# Patient Record
Sex: Male | Born: 1955 | Race: Black or African American | Hispanic: No | Marital: Married | Smoking: Former smoker
Health system: Southern US, Community
[De-identification: ages and names within clinical notes are randomized; demographics above are authoritative.]

## PROBLEM LIST (undated history)

## (undated) DIAGNOSIS — M1990 Unspecified osteoarthritis, unspecified site: Secondary | ICD-10-CM

## (undated) DIAGNOSIS — R739 Hyperglycemia, unspecified: Secondary | ICD-10-CM

## (undated) DIAGNOSIS — I10 Essential (primary) hypertension: Secondary | ICD-10-CM

## (undated) HISTORY — DX: Hyperglycemia, unspecified: R73.9

## (undated) HISTORY — DX: Unspecified osteoarthritis, unspecified site: M19.90

---

## 2004-07-10 ENCOUNTER — Emergency Department: Payer: Self-pay | Admitting: Emergency Medicine

## 2006-07-30 ENCOUNTER — Emergency Department: Payer: Self-pay | Admitting: Emergency Medicine

## 2006-11-10 ENCOUNTER — Inpatient Hospital Stay: Payer: Self-pay | Admitting: Internal Medicine

## 2007-04-22 ENCOUNTER — Ambulatory Visit: Payer: Self-pay | Admitting: Rheumatology

## 2008-09-22 ENCOUNTER — Ambulatory Visit: Payer: Self-pay | Admitting: Family Medicine

## 2008-11-28 ENCOUNTER — Ambulatory Visit: Payer: Self-pay | Admitting: Podiatry

## 2009-07-25 ENCOUNTER — Ambulatory Visit: Payer: Self-pay | Admitting: Family Medicine

## 2009-07-25 HISTORY — PX: SPIROMETRY: SHX456

## 2009-11-29 ENCOUNTER — Ambulatory Visit (HOSPITAL_COMMUNITY)
Admission: RE | Admit: 2009-11-29 | Discharge: 2009-11-30 | Payer: Self-pay | Source: Home / Self Care | Admitting: Specialist

## 2009-11-29 HISTORY — PX: ROTATOR CUFF REPAIR: SHX139

## 2010-04-16 LAB — LIPID PANEL
CHOLESTEROL: 177 mg/dL (ref 0–200)
HDL: 55 mg/dL (ref 35–70)
LDL Cholesterol: 102 mg/dL
TRIGLYCERIDES: 98 mg/dL (ref 40–160)

## 2010-04-16 LAB — BASIC METABOLIC PANEL
BUN: 13 mg/dL (ref 4–21)
CREATININE: 0.9 mg/dL (ref 0.6–1.3)
Glucose: 105 mg/dL
POTASSIUM: 4 mmol/L (ref 3.4–5.3)
Sodium: 139 mmol/L (ref 137–147)

## 2010-04-16 LAB — HEPATIC FUNCTION PANEL
ALT: 24 U/L (ref 10–40)
AST: 16 U/L (ref 14–40)

## 2010-04-16 LAB — PSA: PSA: 1.1

## 2010-04-16 LAB — TSH: TSH: 1.65 u[IU]/mL (ref 0.41–5.90)

## 2010-06-14 LAB — CBC
HCT: 42.6 % (ref 39.0–52.0)
Platelets: 251 10*3/uL (ref 150–400)
RBC: 4.49 MIL/uL (ref 4.22–5.81)
WBC: 5.6 10*3/uL (ref 4.0–10.5)

## 2010-06-14 LAB — SURGICAL PCR SCREEN
MRSA, PCR: NEGATIVE
Staphylococcus aureus: NEGATIVE

## 2010-06-14 LAB — BASIC METABOLIC PANEL
CO2: 28 mEq/L (ref 19–32)
GFR calc non Af Amer: >60 mL/min (ref >60)
Potassium: 3.5 mEq/L (ref 3.5–5.1)
Sodium: 140 mEq/L (ref 135–145)

## 2010-07-19 LAB — HEMOGLOBIN A1C: Hemoglobin A1C: 5.3
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
## 2011-03-26 ENCOUNTER — Ambulatory Visit: Payer: Self-pay | Admitting: Family Medicine

## 2011-06-19 IMAGING — CR DG CHEST 2V
1 series · 2 of 2 positions shown · non-contrast
Comparison: none

REASON FOR EXAM: shortness of breath
COMMENTS:

[Series 1: view not recorded · 0.17mm/px · 2 of 2 slices shown]
[im 1/2]
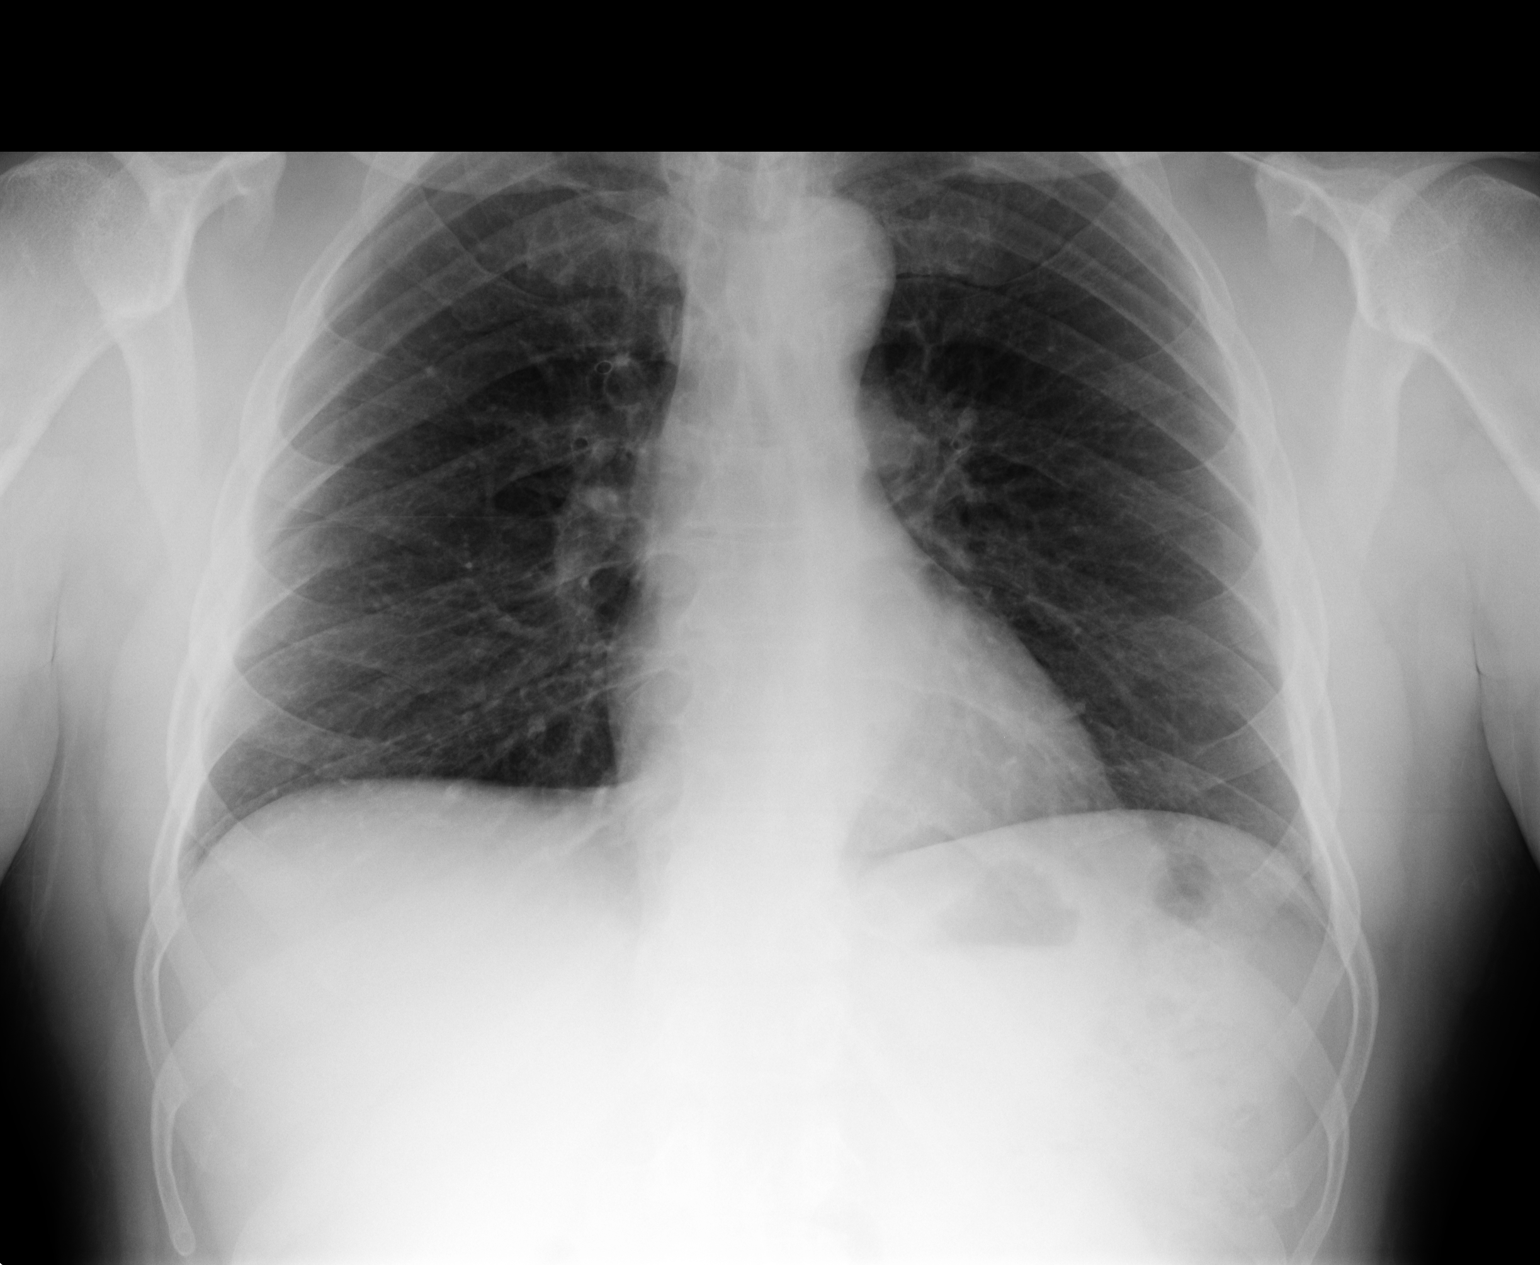
[im 2/2]
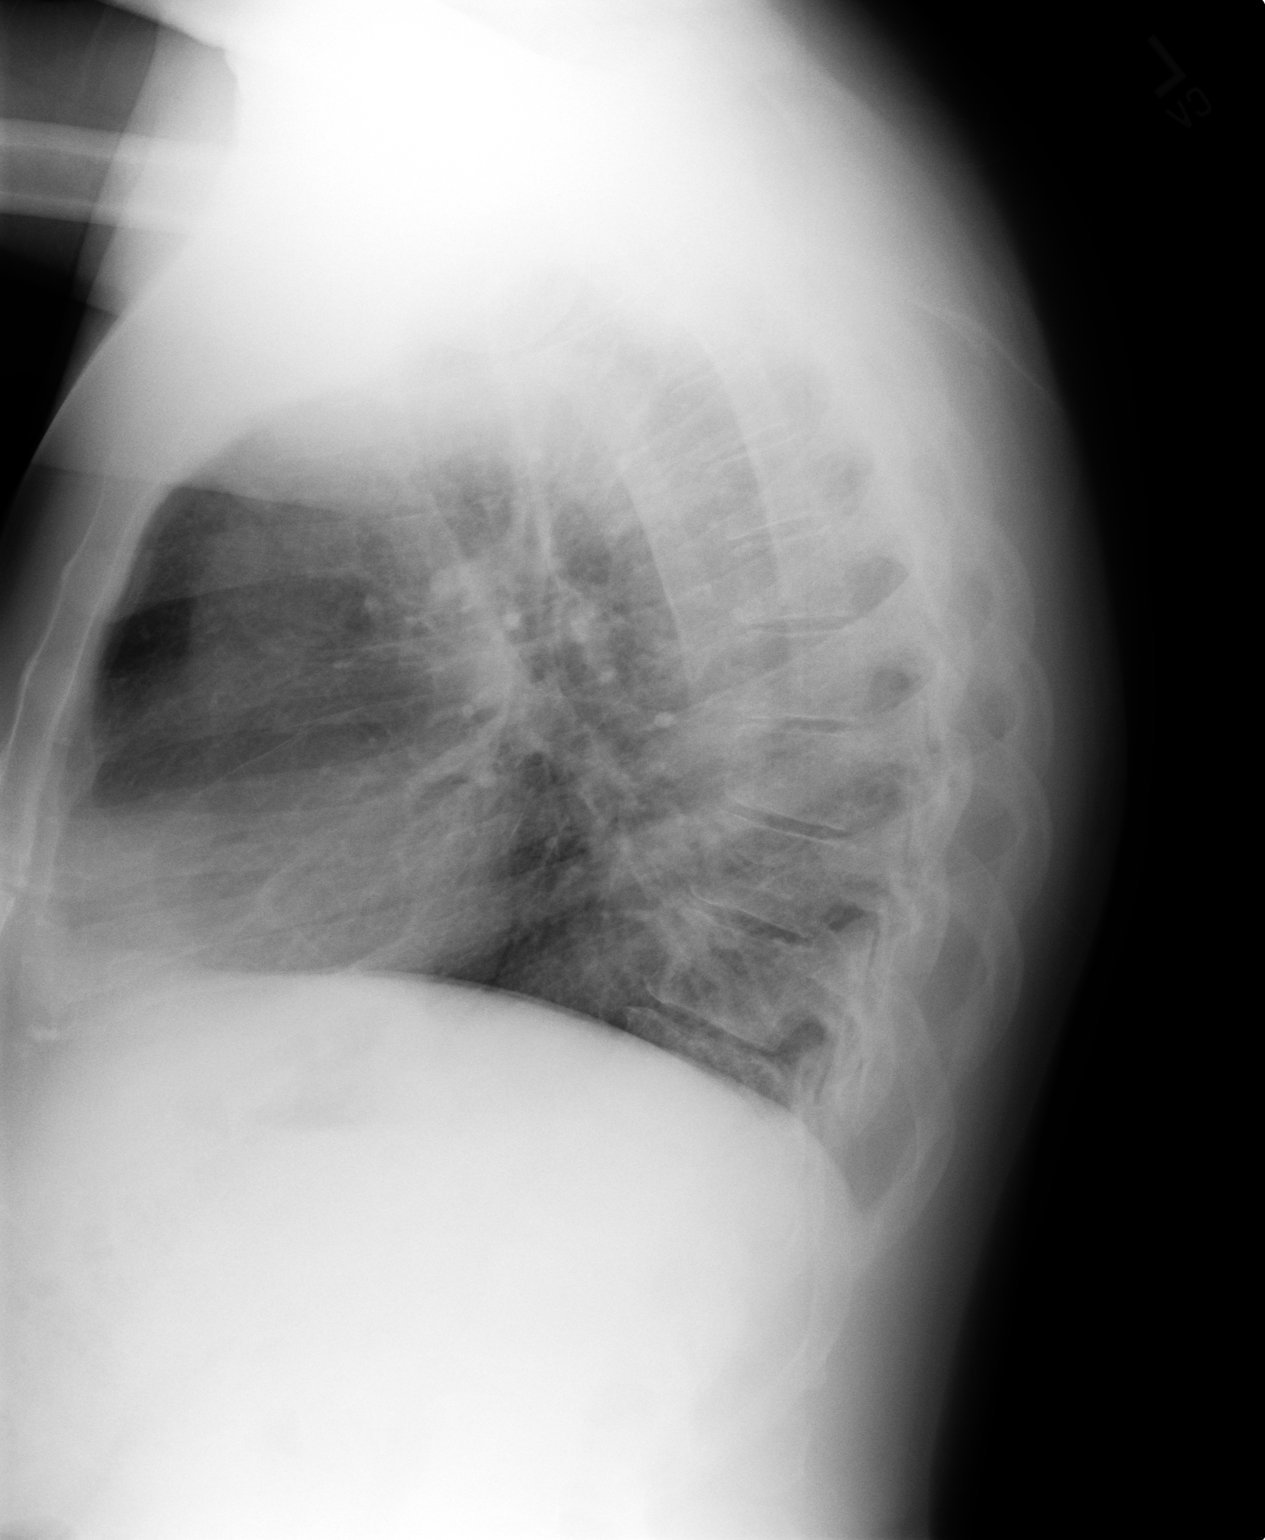

[2 of 2 positions shown; findings below may reference images not displayed]

PROCEDURE:     KDR - KDXR CHEST PA (OR AP) AND LAT  - July 25, 2009  [DATE]

RESULT:     Comparison made to study 22 September, 2008.

The lungs are adequately inflated. There is no focal infiltrate. The
interstitial markings of both lungs are mildly prominent but this is stable.
Is there history of tobacco use? The cardiac silhouette is normal in size.
The pulmonary vascularity is not engorged. There is mild tortuosity of the
descending thoracic aorta. I see no pleural effusion. There is no evidence
of a pneumothorax.
IMPRESSION: I do not see evidence of acute cardiopulmonary abnormality.
I cannot exclude acute bronchitis in the appropriate clinical setting.

## 2013-02-17 IMAGING — CR DG CHEST 2V
1 series · 2 of 2 positions shown · non-contrast
Comparison: none

REASON FOR EXAM: COUGH FEVER
COMMENTS:

[Series 1: pa · 0.17mm/px · 2 of 2 slices shown]
[im 1/2]
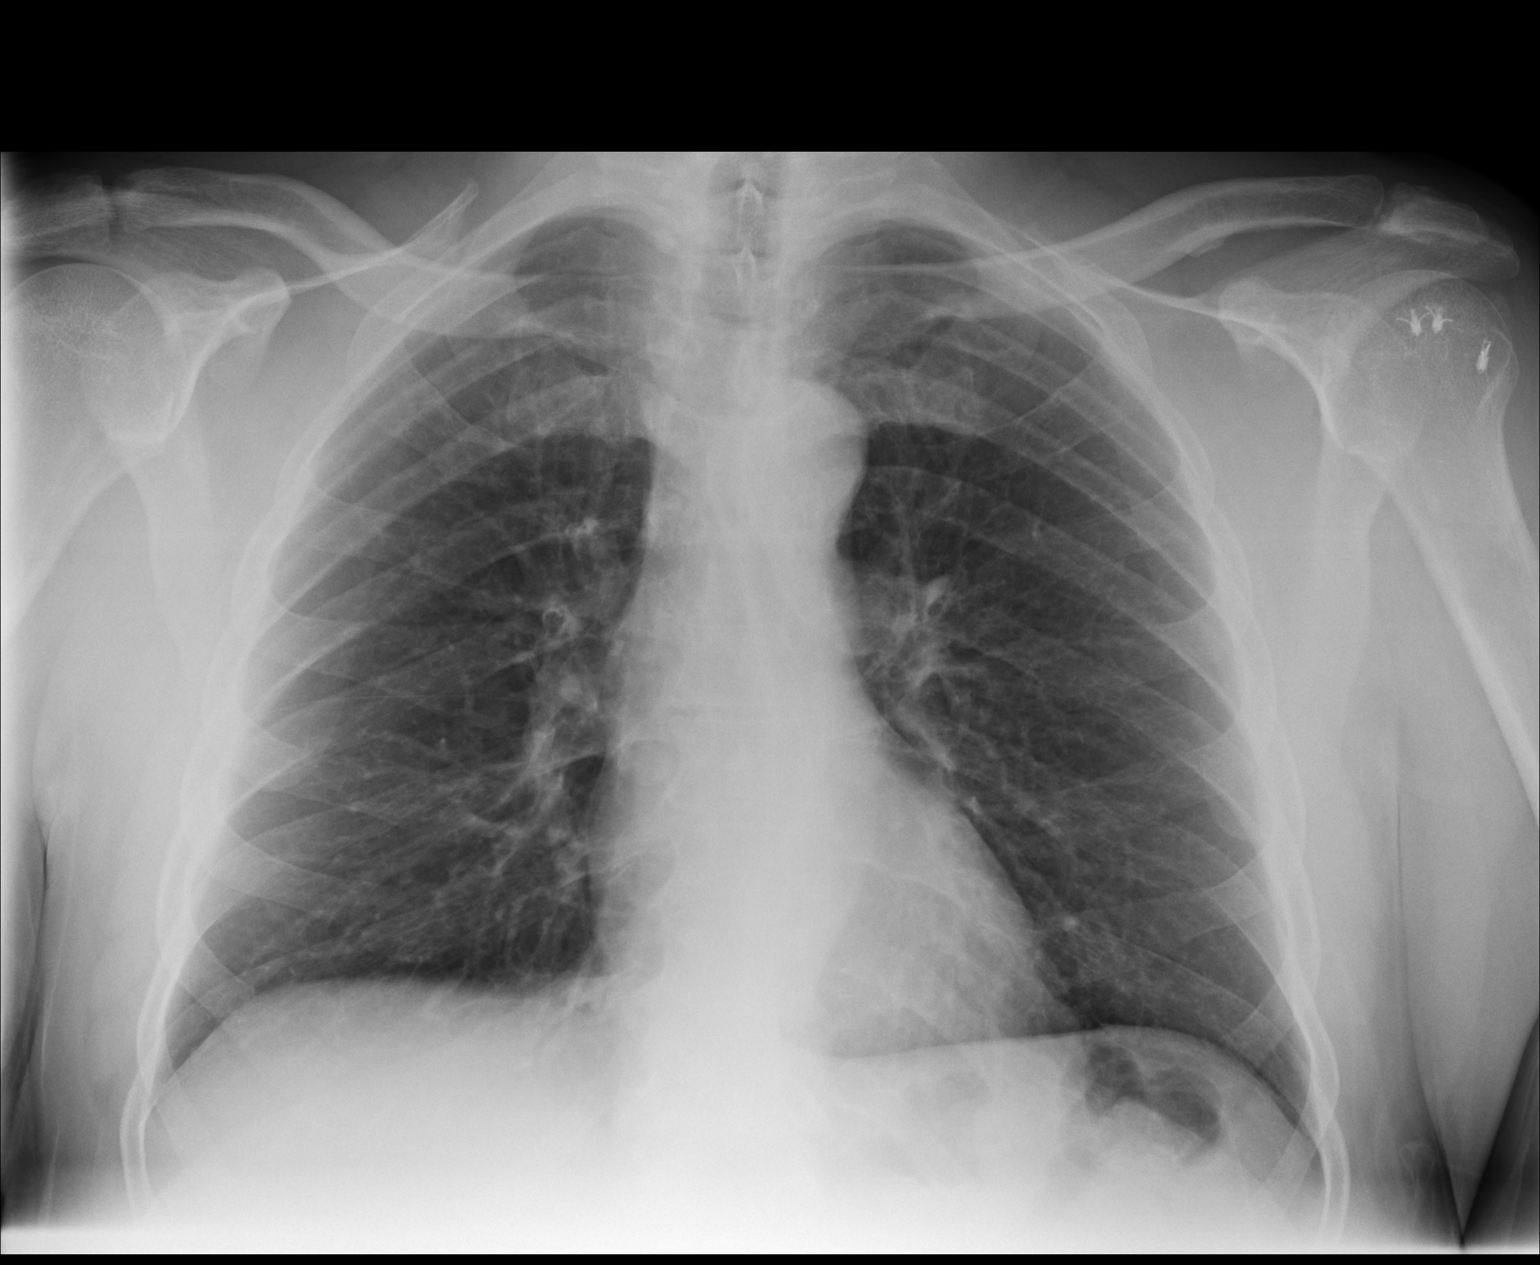
[im 2/2]
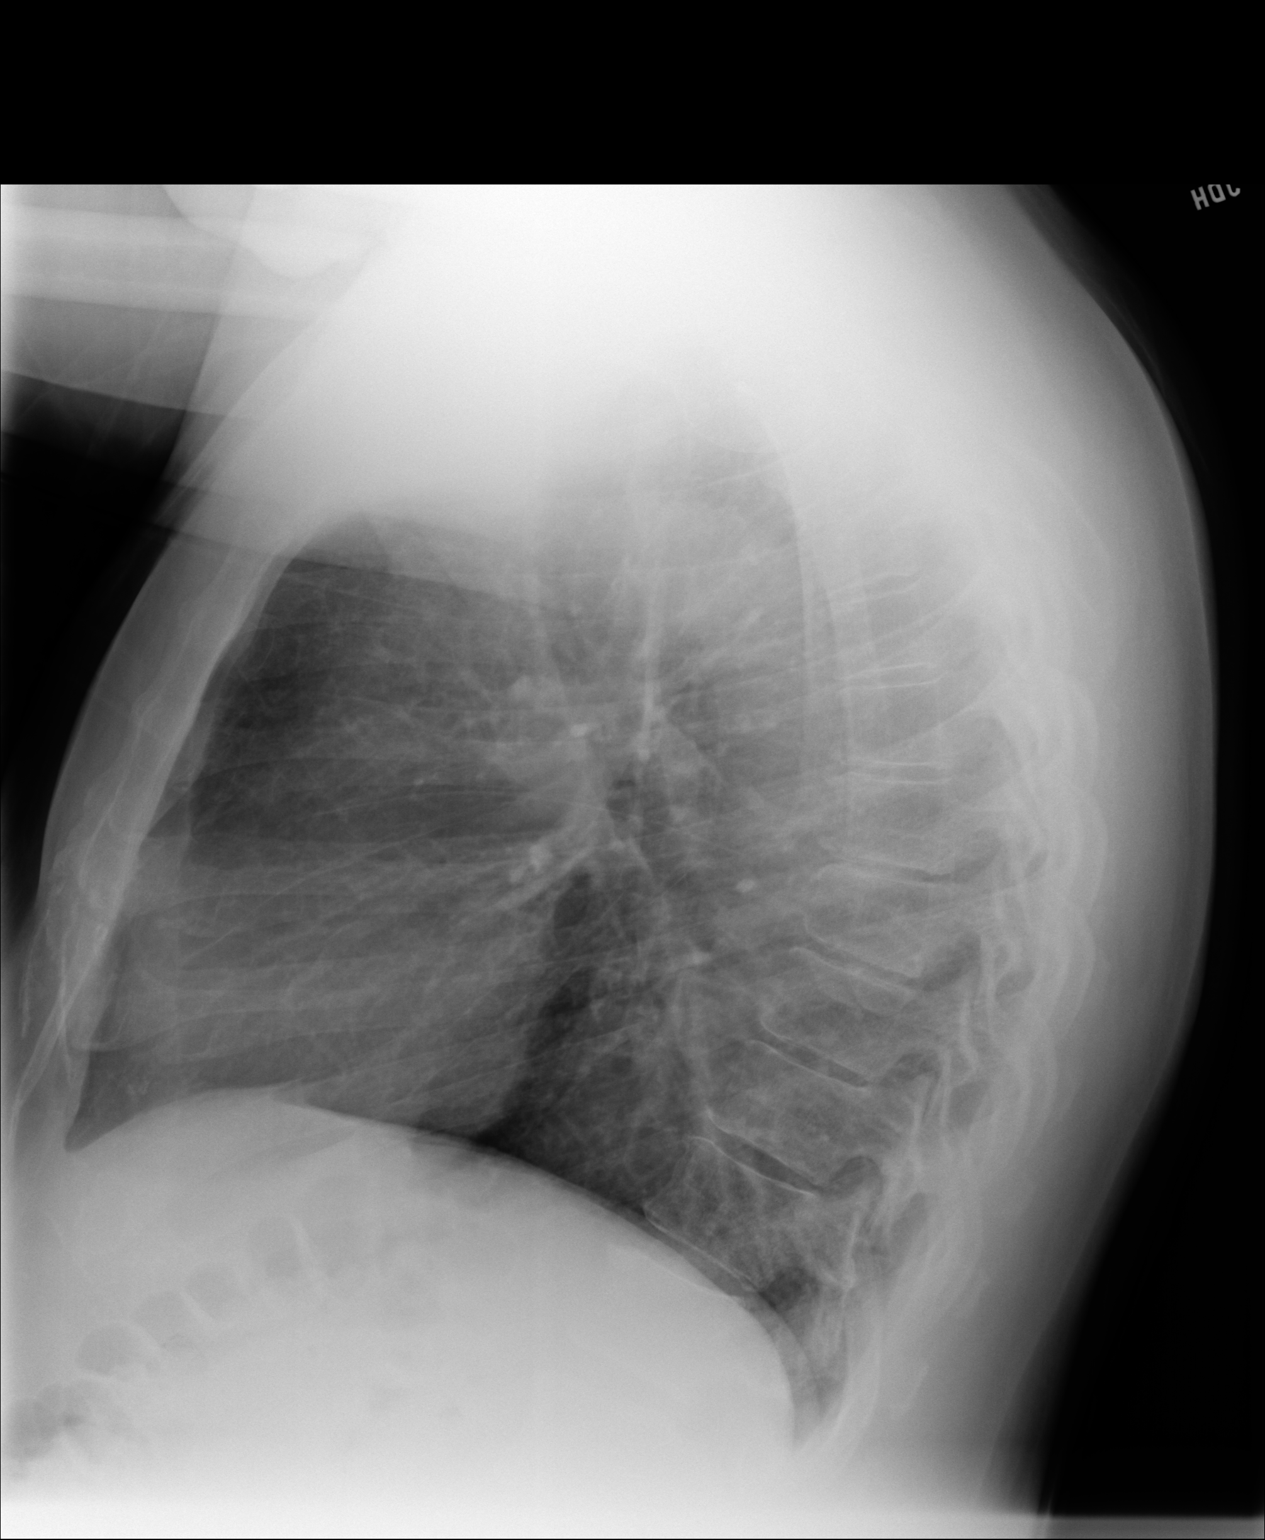

[2 of 2 positions shown; findings below may reference images not displayed]

PROCEDURE:     KDR - KDXR CHEST PA (OR AP) AND LAT  - March 26, 2011 [DATE]

RESULT:     Comparison is made to study dated 25 July, 2009.

The lungs are clear. The heart and pulmonary vessels are normal. The bony
and mediastinal structures are unremarkable. There is no effusion. There is
no pneumothorax or evidence of congestive failure.
IMPRESSION: No acute cardiopulmonary disease. Postoperative changes are
noted in the left humeral head consistent with previous rotator cuff repair.
Stable appearance.

## 2015-03-17 ENCOUNTER — Emergency Department
Admission: EM | Admit: 2015-03-17 | Discharge: 2015-03-17 | Disposition: A | Discharge status: Home / Self Care | Payer: 59 | Attending: Emergency Medicine | Admitting: Emergency Medicine

## 2015-03-17 DIAGNOSIS — R030 Elevated blood-pressure reading, without diagnosis of hypertension: Secondary | ICD-10-CM | POA: Diagnosis present

## 2015-03-17 DIAGNOSIS — I10 Essential (primary) hypertension: Secondary | ICD-10-CM | POA: Insufficient documentation

## 2015-03-17 LAB — CBC
HCT: 43.3 % (ref 40.0–52.0)
Hemoglobin: 14.8 g/dL (ref 13.0–18.0)
MCH: 32.1 pg (ref 26.0–34.0)
MCHC: 34.1 g/dL (ref 32.0–36.0)
MCV: 94.2 fL (ref 80.0–100.0)
PLATELETS: 257 10*3/uL (ref 150–440)
RBC: 4.59 MIL/uL (ref 4.40–5.90)
RDW: 13.7 % (ref 11.5–14.5)
WBC: 5.7 10*3/uL (ref 3.8–10.6)

## 2015-03-17 LAB — COMPREHENSIVE METABOLIC PANEL
ALBUMIN: 4.3 g/dL (ref 3.5–5.0)
ALT: 22 U/L (ref 17–63)
ANION GAP: 5 (ref 5–15)
AST: 21 U/L (ref 15–41)
Alkaline Phosphatase: 43 U/L (ref 38–126)
BILIRUBIN TOTAL: 0.7 mg/dL (ref 0.3–1.2)
BUN: 15 mg/dL (ref 6–20)
CHLORIDE: 108 mmol/L (ref 101–111)
CO2: 27 mmol/L (ref 22–32)
Calcium: 9.1 mg/dL (ref 8.9–10.3)
Creatinine, Ser: 0.9 mg/dL (ref 0.61–1.24)
GFR calc Af Amer: >60 mL/min (ref >60)
GFR calc non Af Amer: >60 mL/min (ref >60)
GLUCOSE: 106 mg/dL — AB (ref 65–99)
POTASSIUM: 3.8 mmol/L (ref 3.5–5.1)
SODIUM: 140 mmol/L (ref 135–145)
TOTAL PROTEIN: 7.2 g/dL (ref 6.5–8.1)

## 2015-03-17 LAB — TROPONIN I: Troponin I: <0.03 ng/mL (ref <0.031)

## 2015-03-17 MED ORDER — HYDROCHLOROTHIAZIDE 25 MG PO TABS: 25.0000 mg | ORAL_TABLET | Freq: Every day | ORAL | Status: DC

## 2015-03-17 MED ORDER — HYDROCHLOROTHIAZIDE 25 MG PO TABS
25.0000 mg | ORAL_TABLET | Freq: Every day | ORAL | Status: DC
  Administered 2015-03-17: 25 mg via ORAL
  Filled 2015-03-17: qty 1

## 2015-03-17 NOTE — Discharge Instructions (Signed)
How to Take Your Blood Pressure HOW DO I GET A BLOOD PRESSURE MACHINE?  You can buy an electronic home blood pressure machine at your local pharmacy. Insurance will sometimes cover the cost if you have a prescription.  Ask your doctor what type of machine is best for you. There are different machines for your arm and your wrist.  If you decide to buy a machine to check your blood pressure on your arm, first check the size of your arm so you can buy the right size cuff. To check the size of your arm:   Use a measuring tape that shows both inches and centimeters.   Wrap the measuring tape around the upper-middle part of your arm. You may need someone to help you measure.   Write down your arm measurement in both inches and centimeters.   To measure your blood pressure correctly, it is important to have the right size cuff.   If your arm is up to 13 inches (up to 34 centimeters), get an adult cuff size.  If your arm is 13 to 17 inches (35 to 44 centimeters), get a large adult cuff size.    If your arm is 17 to 20 inches (45 to 52 centimeters), get an adult thigh cuff.  WHAT DO THE NUMBERS MEAN?   There are two numbers that make up your blood pressure. For example: 120/80.  The first number (120 in our example) is called the "systolic pressure." It is a measure of the pressure in your blood vessels when your heart is pumping blood.  The second number (80 in our example) is called the "diastolic pressure." It is a measure of the pressure in your blood vessels when your heart is resting between beats.  Your doctor will tell you what your blood pressure should be. WHAT SHOULD I DO BEFORE I CHECK MY BLOOD PRESSURE?   Try to rest or relax for at least 30 minutes before you check your blood pressure.  Do not smoke.  Do not have any drinks with caffeine, such as:  Soda.  Coffee.  Tea.  Check your blood pressure in a quiet room.  Sit down and stretch out your arm on a table.  Keep your arm at about the level of your heart. Let your arm relax.  Make sure that your legs are not crossed. HOW DO I CHECK MY BLOOD PRESSURE?  Follow the directions that came with your machine.  Make sure you remove any tight-fitting clothing from your arm or wrist. Wrap the cuff around your upper arm or wrist. You should be able to fit a finger between the cuff and your arm. If you cannot fit a finger between the cuff and your arm, it is too tight and should be removed and rewrapped.  Some units require you to manually pump up the arm cuff.  Automatic units inflate the cuff when you press a button.  Cuff deflation is automatic in both models.  After the cuff is inflated, the unit measures your blood pressure and pulse. The readings are shown on a monitor. Hold still and breathe normally while the cuff is inflated.  Getting a reading takes less than a minute.  Some models store readings in a memory. Some provide a printout of readings. If your machine does not store your readings, keep a written record.  Take readings with you to your next visit with your doctor.   This information is not intended to replace advice given to  you by your health care provider. Make sure you discuss any questions you have with your health care provider.   Document Released: 02/28/2008 Document Revised: 04/07/2014 Document Reviewed: 05/12/2013 Elsevier Interactive Patient Education 2016 [employer].  Hypertension Hypertension, commonly called high blood pressure, is when the force of blood pumping through your arteries is too strong. Your arteries are the blood vessels that carry blood from your heart throughout your body. A blood pressure reading consists of a higher number over a lower number, such as 110/72. The higher number (systolic) is the pressure inside your arteries when your heart pumps. The lower number (diastolic) is the pressure inside your arteries when your heart relaxes. Ideally you want  your blood pressure below 120/80. Hypertension forces your heart to work harder to pump blood. Your arteries may become narrow or stiff. Having untreated or uncontrolled hypertension can cause heart attack, stroke, kidney disease, and other problems. RISK FACTORS Some risk factors for high blood pressure are controllable. Others are not.  Risk factors you cannot control include:   Race. You may be at higher risk if you are African American.  Age. Risk increases with age.  Gender. Men are at higher risk than women before age 27 years. After age 103, women are at higher risk than men. Risk factors you can control include:  Not getting enough exercise or physical activity.  Being overweight.  Getting too much fat, sugar, calories, or salt in your diet.  Drinking too much alcohol. SIGNS AND SYMPTOMS Hypertension does not usually cause signs or symptoms. Extremely high blood pressure (hypertensive crisis) may cause headache, anxiety, shortness of breath, and nosebleed. DIAGNOSIS To check if you have hypertension, your health care provider will measure your blood pressure while you are seated, with your arm held at the level of your heart. It should be measured at least twice using the same arm. Certain conditions can cause a difference in blood pressure between your right and left arms. A blood pressure reading that is higher than normal on one occasion does not mean that you need treatment. If it is not clear whether you have high blood pressure, you may be asked to return on a different day to have your blood pressure checked again. Or, you may be asked to monitor your blood pressure at home for 1 or more weeks. TREATMENT Treating high blood pressure includes making lifestyle changes and possibly taking medicine. Living a healthy lifestyle can help lower high blood pressure. You may need to change some of your habits. Lifestyle changes may include:  Following the DASH diet. This diet is high  in fruits, vegetables, and whole grains. It is low in salt, red meat, and added sugars.  Keep your sodium intake below 2,300 mg per day.  Getting at least 30-45 minutes of aerobic exercise at least 4 times per week.  Losing weight if necessary.  Not smoking.  Limiting alcoholic beverages.  Learning ways to reduce stress. Your health care provider may prescribe medicine if lifestyle changes are not enough to get your blood pressure under control, and if one of the following is true:  You are 52-8 years of age and your systolic blood pressure is above 140.  You are 21 years of age or older, and your systolic blood pressure is above 150.  Your diastolic blood pressure is above 90.  You have diabetes, and your systolic blood pressure is over [id number] or your diastolic blood pressure is over 90.  You have kidney disease  and your blood pressure is above 140/90.  You have heart disease and your blood pressure is above 140/90. Your personal target blood pressure may vary depending on your medical conditions, your age, and other factors. HOME CARE INSTRUCTIONS  Have your blood pressure rechecked as directed by your health care provider.   Take medicines only as directed by your health care provider. Follow the directions carefully. Blood pressure medicines must be taken as prescribed. The medicine does not work as well when you skip doses. Skipping doses also puts you at risk for problems.  Do not smoke.   Monitor your blood pressure at home as directed by your health care provider. SEEK MEDICAL CARE IF:   You think you are having a reaction to medicines taken.  You have recurrent headaches or feel dizzy.  You have swelling in your ankles.  You have trouble with your vision. SEEK IMMEDIATE MEDICAL CARE IF:  You develop a severe headache or confusion.  You have unusual weakness, numbness, or feel faint.  You have severe chest or abdominal pain.  You vomit repeatedly.  You  have trouble breathing. MAKE SURE YOU:   Understand these instructions.  Will watch your condition.  Will get help right away if you are not doing well or get worse.   This information is not intended to replace advice given to you by your health care provider. Make sure you discuss any questions you have with your health care provider.   Document Released: 03/17/2005 Document Revised: 08/01/2014 Document Reviewed: 01/07/2013 Elsevier Interactive Patient Education [employer].

## 2015-03-17 NOTE — ED Notes (Signed)
Pt states that he was feeling dizzy today, took blood pressure at home and got 123456 systolic. Pt then went to [facility] and took BP again and got systolic of [id number]. Pt no hx of hypertension, not on any mediations. Denies pain. Pt alert and oriented X4, active, cooperative, pt in NAD. RR even and unlabored, color WNL.

## 2015-03-17 NOTE — ED Provider Notes (Signed)
[facility] Emergency Department Provider Note  ____________________________________________  Time seen: 7:50 PM  I have reviewed the triage vital signs and the nursing notes.   HISTORY  Chief Complaint Hypertension    HPI Jeffrey Villa is a 59 y.o. male who presents with high blood pressure today. He states that he is feeling dizzy earlier after eating over for a [place] period time standing up quickly, he took his blood pressure and found that it was 200/100. He went in to a pharmacy and checked it again and found a Systolic of [id number]. No Prior Medical History No Medications. He Has Seen Dr. Caryn Section from Primary Care in the past but Hasn't Followed up in A Few Years.     History reviewed. No pertinent past medical history.   There are no active problems to display for this patient.    History reviewed. No pertinent past surgical history.   Current Outpatient Rx  Name  Route  Sig  Dispense  Refill  . hydrochlorothiazide (HYDRODIURIL) 25 MG tablet   Oral   Take 1 tablet (25 mg total) by mouth daily.   30 tablet   0      Allergies Review of patient's allergies indicates no known allergies.   No family history on file.  Social History Social History  Substance Use Topics  . Smoking status: Never Smoker   . Smokeless tobacco: None  . Alcohol Use: Yes    Review of Systems  Constitutional:   No fever or chills. No weight changes Eyes:   No blurry vision or double vision.  ENT:   No sore throat. Cardiovascular:   No chest pain. Respiratory:   No dyspnea or cough. Gastrointestinal:   Negative for abdominal pain, vomiting and diarrhea.  No BRBPR or melena. Genitourinary:   Negative for dysuria, urinary retention, bloody urine, or difficulty urinating. Musculoskeletal:   Negative for back pain. No joint swelling or pain. Skin:   Negative for rash. Neurological:   Negative for headaches, focal weakness or numbness. Positive  dizziness Psychiatric:  No anxiety or depression.   Endocrine:  No hot/cold intolerance, changes in energy, or sleep difficulty.  10-point ROS otherwise negative.  ____________________________________________   PHYSICAL EXAM:  VITAL SIGNS: ED Triage Vitals  Enc Vitals Group     BP 03/17/15 1725 160/85 mmHg     Pulse Rate 03/17/15 1725 95     Resp 03/17/15 1725 16     Temp 03/17/15 1725 98.3 F (36.8 C)     Temp Source 03/17/15 1725 Oral     SpO2 03/17/15 1725 97 %     Weight 03/17/15 1725 218 lb (98.884 kg)     Height 03/17/15 1725 6' (1.829 m)     Head Cir --      Peak Flow --      Pain Score --      Pain Loc --      Pain Edu? --      Excl. in [place]? --     Vital signs reviewed, nursing assessments reviewed.   Constitutional:   Alert and oriented. Well appearing and in no distress. Eyes:   No scleral icterus. No conjunctival pallor. PERRL. EOMI ENT   Head:   Normocephalic and atraumatic.   Nose:   No congestion/rhinnorhea. No septal hematoma   Mouth/Throat:   MMM, no pharyngeal erythema. No peritonsillar mass. No uvula shift.   Neck:   No stridor. No SubQ emphysema. No meningismus. Hematological/Lymphatic/Immunilogical:  No cervical lymphadenopathy. Cardiovascular:   RRR. Normal and symmetric distal pulses are present in all extremities. No murmurs, rubs, or gallops. Respiratory:   Normal respiratory effort without tachypnea nor retractions. Breath sounds are clear and equal bilaterally. No wheezes/rales/rhonchi. Gastrointestinal:   Soft and nontender. No distention. There is no CVA tenderness.  No rebound, rigidity, or guarding. Genitourinary:   deferred Musculoskeletal:   Nontender with normal range of motion in all extremities. No joint effusions.  No lower extremity tenderness.  No edema. Neurologic:   Normal speech and language.  CN 2-10 normal. Motor grossly intact. No pronator drift.  Normal gait. No gross focal neurologic deficits are  appreciated.  Skin:    Skin is warm, dry and intact. No rash noted.  No petechiae, purpura, or bullae. Psychiatric:   Mood and affect are normal. Speech and behavior are normal. Patient exhibits appropriate insight and judgment.  ____________________________________________    LABS (pertinent positives/negatives) (all labs ordered are listed, but only abnormal results are displayed) Labs Reviewed  COMPREHENSIVE METABOLIC PANEL - Abnormal; Notable for the following:    Glucose, Bld 106 (*)    All other components within normal limits  CBC  TROPONIN I   ____________________________________________   EKG  Interpreted by me Sinus rhythm rate of 93, normal axis intervals. Poor R-wave progression in anterior precordial leads. Normal ST segments. T wave inversions in 3 aVF.  ____________________________________________    RADIOLOGY    ____________________________________________   PROCEDURES   ____________________________________________   INITIAL IMPRESSION / ASSESSMENT AND PLAN / ED COURSE  Pertinent labs & imaging results that were available during my care of the patient were reviewed by me and considered in my medical decision making (see chart for details).  Patient presents with hypertension. He had some dizziness but this actually sounds more orthostatic than related to the blood pressure. He has essentially no cardiopulmonary symptoms are exertional symptoms have low suspicion for ACS PE TAD pneumothorax or carditis  pneumonia or sepsis. No evidence by exam or history of any renal pathology that may be causing this. He is not having any urinary symptoms. As including troponin are unremarkable. I suspect that his EKG his baseline although I have nothing to compare to. We will start him on HCTZ and have him follow up with primary care.     ____________________________________________   FINAL CLINICAL IMPRESSION(S) / ED DIAGNOSES  Final diagnoses:  Essential  hypertension      Carrie Mew, MD 03/17/15 2011

## 2015-03-19 ENCOUNTER — Emergency Department
Admission: EM | Admit: 2015-03-19 | Discharge: 2015-03-19 | Disposition: A | Discharge status: Home / Self Care | Payer: 59 | Attending: Emergency Medicine | Admitting: Emergency Medicine

## 2015-03-19 ENCOUNTER — Encounter: Payer: Self-pay | Admitting: Emergency Medicine

## 2015-03-19 DIAGNOSIS — Z79899 Other long term (current) drug therapy: Secondary | ICD-10-CM | POA: Diagnosis not present

## 2015-03-19 DIAGNOSIS — I10 Essential (primary) hypertension: Secondary | ICD-10-CM

## 2015-03-19 HISTORY — DX: Essential (primary) hypertension: I10

## 2015-03-19 LAB — TROPONIN I

## 2015-03-19 MED ORDER — AMLODIPINE BESYLATE 2.5 MG PO TABS: 2.5000 mg | ORAL_TABLET | Freq: Every day | ORAL | Status: DC

## 2015-03-19 MED ORDER — AMLODIPINE BESYLATE 5 MG PO TABS
2.5000 mg | ORAL_TABLET | Freq: Once | ORAL | Status: AC
  Administered 2015-03-19: 2.5 mg via ORAL
  Filled 2015-03-19: qty 1

## 2015-03-19 NOTE — ED Notes (Signed)
Pt resting with eyes closed. Wife at bedside.

## 2015-03-19 NOTE — ED Provider Notes (Signed)
[facility] Emergency Department Provider Note  ____________________________________________  Time seen: Approximately [id number] AM  I have reviewed the triage vital signs and the nursing notes.   HISTORY  Chief Complaint Hypertension    HPI Jeffrey Villa is a 59 y.o. male with a recent diagnosis of hypertension who is presenting to the emergency department today with high blood pressure. He says that he was here 2 days ago and was started on HCTZ 25 mg twice a day. He has taken 3 doses of the medicine total including this morning. He is returning to the emergency department because of several minutes of a pounding headache to the right frontal part of his forehead as well as a brief several minute pressure-like neck pain on the left side of his neck. He denies any pain at this time. Denies any shortness of breath or chest pain. Does not smoke. Denies any drug use. Denies any history of cardiac disease in his family. He said the last time he was seen by a physician was 2 years ago.   Past Medical History  Diagnosis Date  . Hypertension     There are no active problems to display for this patient.   History reviewed. No pertinent past surgical history.  Current Outpatient Rx  Name  Route  Sig  Dispense  Refill  . hydrochlorothiazide (HYDRODIURIL) 25 MG tablet   Oral   Take 1 tablet (25 mg total) by mouth daily.   30 tablet   0     Allergies Review of patient's allergies indicates no known allergies.  History reviewed. No pertinent family history.  Social History Social History  Substance Use Topics  . Smoking status: Never Smoker   . Smokeless tobacco: None  . Alcohol Use: Yes    Review of Systems Constitutional: No fever/chills Eyes: No visual changes. ENT: No sore throat. Cardiovascular: Denies chest pain. Respiratory: Denies shortness of breath. Gastrointestinal: No abdominal pain.  No nausea, no vomiting.  No diarrhea.  No  constipation. Genitourinary: Negative for dysuria. Musculoskeletal: Negative for back pain. Skin: Negative for rash. Neurological: Negative for headaches, focal weakness or numbness.  10-point ROS otherwise negative.  ____________________________________________   PHYSICAL EXAM:  VITAL SIGNS: ED Triage Vitals  Enc Vitals Group     BP 03/19/15 0930 148/78 mmHg     Pulse Rate 03/19/15 0930 68     Resp 03/19/15 0930 18     Temp 03/19/15 0930 98.3 F (36.8 C)     Temp Source 03/19/15 0930 Oral     SpO2 03/19/15 0930 98 %     Weight 03/19/15 0930 215 lb (97.523 kg)     Height 03/19/15 0930 6' (1.829 m)     Head Cir --      Peak Flow --      Pain Score --      Pain Loc --      Pain Edu? --      Excl. in [place]? --     Constitutional: Alert and oriented. Well appearing and in no acute distress. Eyes: Conjunctivae are normal. PERRL. EOMI. Head: Atraumatic. Nose: No congestion/rhinnorhea. Mouth/Throat: Mucous membranes are moist.  Oropharynx non-erythematous. Neck: No stridor.  No tenderness palpation. Cardiovascular: Normal rate, regular rhythm. Grossly normal heart sounds.  Good peripheral circulation. Respiratory: Normal respiratory effort.  No retractions. Lungs CTAB. Gastrointestinal: Soft and nontender. No distention. No abdominal bruits. No CVA tenderness. Musculoskeletal: No lower extremity tenderness nor edema.  No joint effusions. Neurologic:  Normal speech and language. No gross focal neurologic deficits are appreciated. No gait instability. Skin:  Skin is warm, dry and intact. No rash noted. Psychiatric: Mood and affect are normal. Speech and behavior are normal.  ____________________________________________   LABS (all labs ordered are listed, but only abnormal results are displayed)  Labs Reviewed  TROPONIN I   ____________________________________________  EKG  Initial EKG with poor baseline.  ED ECG REPORT I, Doran Stabler, the attending  physician, personally viewed and interpreted this ECG.   Date: 03/19/2015  EKG Time: 1203  Rate: 64  Rhythm: normal sinus rhythm  Axis: Normal axis  Intervals:none  ST&T Change: No ST segment elevation or depression. No abnormal T-wave inversion.  ____________________________________________  RADIOLOGY   ____________________________________________   PROCEDURES    ____________________________________________   INITIAL IMPRESSION / ASSESSMENT AND PLAN / ED COURSE  Pertinent labs & imaging results that were available during my care of the patient were reviewed by me and considered in my medical decision making (see chart for details).  Will add amlodipine 2.5 mg. Patient is still over [id number] systolic after 3 doses. Plans to follow-up with his primary care doctor within a week.  ----------------------------------------- 12:48 PM on 03/19/2015 -----------------------------------------  Patient with blood pressure improving with systolic in the [id number]. We'll discharge with amlodipine 2.5 mg daily. Patient to follow-up with his primary care doctor brought to family practice. Understands the plan and is one to comply. Continues to be pain-free. ____________________________________________   FINAL CLINICAL IMPRESSION(S) / ED DIAGNOSES  Uncontrolled hypertension.    Orbie Pyo, MD 03/19/15 1248

## 2015-03-19 NOTE — ED Notes (Signed)
Pt resting, wife at bedside.  

## 2015-03-19 NOTE — ED Notes (Signed)
Pt resting in bed with no complaints at this time.

## 2015-03-19 NOTE — ED Notes (Signed)
Pt states his head began pounding this am, states he just didn't feel right, bp taken at home was 201/101 per pt. 155/101 at this time. Pt denies pain at this time. Wife at bedside.

## 2015-03-19 NOTE — Discharge Instructions (Signed)
Hypertension Hypertension, commonly called high blood pressure, is when the force of blood pumping through your arteries is too strong. Your arteries are the blood vessels that carry blood from your heart throughout your body. A blood pressure reading consists of a higher number over a lower number, such as 110/72. The higher number (systolic) is the pressure inside your arteries when your heart pumps. The lower number (diastolic) is the pressure inside your arteries when your heart relaxes. Ideally you want your blood pressure below 120/80. Hypertension forces your heart to work harder to pump blood. Your arteries may become narrow or stiff. Having untreated or uncontrolled hypertension can cause heart attack, stroke, kidney disease, and other problems. RISK FACTORS Some risk factors for high blood pressure are controllable. Others are not.  Risk factors you cannot control include:   Race. You may be at higher risk if you are African American.  Age. Risk increases with age.  Gender. Men are at higher risk than women before age 45 years. After age 65, women are at higher risk than men. Risk factors you can control include:  Not getting enough exercise or physical activity.  Being overweight.  Getting too much fat, sugar, calories, or salt in your diet.  Drinking too much alcohol. SIGNS AND SYMPTOMS Hypertension does not usually cause signs or symptoms. Extremely high blood pressure (hypertensive crisis) may cause headache, anxiety, shortness of breath, and nosebleed. DIAGNOSIS To check if you have hypertension, your health care provider will measure your blood pressure while you are seated, with your arm held at the level of your heart. It should be measured at least twice using the same arm. Certain conditions can cause a difference in blood pressure between your right and left arms. A blood pressure reading that is higher than normal on one occasion does not mean that you need treatment. If  it is not clear whether you have high blood pressure, you may be asked to return on a different day to have your blood pressure checked again. Or, you may be asked to monitor your blood pressure at home for 1 or more weeks. TREATMENT Treating high blood pressure includes making lifestyle changes and possibly taking medicine. Living a healthy lifestyle can help lower high blood pressure. You may need to change some of your habits. Lifestyle changes may include:  Following the DASH diet. This diet is high in fruits, vegetables, and whole grains. It is low in salt, red meat, and added sugars.  Keep your sodium intake below 2,300 mg per day.  Getting at least 30-45 minutes of aerobic exercise at least 4 times per week.  Losing weight if necessary.  Not smoking.  Limiting alcoholic beverages.  Learning ways to reduce stress. Your health care provider may prescribe medicine if lifestyle changes are not enough to get your blood pressure under control, and if one of the following is true:  You are 18-59 years of age and your systolic blood pressure is above 140.  You are 60 years of age or older, and your systolic blood pressure is above 150.  Your diastolic blood pressure is above 90.  You have diabetes, and your systolic blood pressure is over 140 or your diastolic blood pressure is over 90.  You have kidney disease and your blood pressure is above 140/90.  You have heart disease and your blood pressure is above 140/90. Your personal target blood pressure may vary depending on your medical conditions, your age, and other factors. HOME CARE INSTRUCTIONS    Have your blood pressure rechecked as directed by your health care provider.   Take medicines only as directed by your health care provider. Follow the directions carefully. Blood pressure medicines must be taken as prescribed. The medicine does not work as well when you skip doses. Skipping doses also puts you at risk for  problems.  Do not smoke.   Monitor your blood pressure at home as directed by your health care provider. SEEK MEDICAL CARE IF:   You think you are having a reaction to medicines taken.  You have recurrent headaches or feel dizzy.  You have swelling in your ankles.  You have trouble with your vision. SEEK IMMEDIATE MEDICAL CARE IF:  You develop a severe headache or confusion.  You have unusual weakness, numbness, or feel faint.  You have severe chest or abdominal pain.  You vomit repeatedly.  You have trouble breathing. MAKE SURE YOU:   Understand these instructions.  Will watch your condition.  Will get help right away if you are not doing well or get worse.   This information is not intended to replace advice given to you by your health care provider. Make sure you discuss any questions you have with your health care provider.   Document Released: 03/17/2005 Document Revised: 08/01/2014 Document Reviewed: 01/07/2013 Elsevier Interactive Patient Education 2016 Elsevier Inc.  

## 2015-03-19 NOTE — ED Notes (Signed)
Pt reports starting bp meds 2 days ago, states he checked bp at [employer] and it was high.  Denies headache or dizziness, but states "it just aint right".

## 2015-04-11 ENCOUNTER — Telehealth: Payer: Self-pay | Admitting: Family Medicine

## 2015-04-11 NOTE — Telephone Encounter (Signed)
Pt's wife called to get pt a CPE appt. Pt was last seen in the office on 03/26/11. Wife stated pt hasn't been to another PCP. Can we re-establish? Please advise. Thanks TNP

## 2015-04-11 NOTE — Telephone Encounter (Signed)
OK to re-establish with CPE

## 2015-04-12 NOTE — Telephone Encounter (Signed)
Spoke with Pt's wife and scheduled pt on 04/17/15. Thanks TNP

## 2015-04-13 DIAGNOSIS — R030 Elevated blood-pressure reading, without diagnosis of hypertension: Secondary | ICD-10-CM | POA: Insufficient documentation

## 2015-04-13 DIAGNOSIS — R739 Hyperglycemia, unspecified: Secondary | ICD-10-CM | POA: Insufficient documentation

## 2015-04-13 DIAGNOSIS — M130 Polyarthritis, unspecified: Secondary | ICD-10-CM | POA: Insufficient documentation

## 2015-04-17 ENCOUNTER — Ambulatory Visit: Payer: Self-pay | Admitting: Family Medicine

## 2015-06-14 ENCOUNTER — Telehealth: Payer: Self-pay | Admitting: Family Medicine

## 2015-06-14 NOTE — Telephone Encounter (Signed)
Pt called wanting a physical  He has not been in since 2012,  He has not established care with any other providers since then.  He is not on any medications .  Can he come in for a physical with you 06/22/15.  Please advise and i will let him know.  Thanks Con Memos

## 2015-06-15 NOTE — Telephone Encounter (Signed)
That's fine

## 2015-06-15 NOTE — Telephone Encounter (Signed)
Called pt. No answer LMTCB. Thanks TNP

## 2015-06-15 NOTE — Telephone Encounter (Signed)
Pt called back and stated that Terri had went ahead and scheduled his CPE for 06/22/15. Thanks TNP

## 2015-06-15 NOTE — Telephone Encounter (Signed)
Pease advise.

## 2015-06-22 ENCOUNTER — Ambulatory Visit (INDEPENDENT_AMBULATORY_CARE_PROVIDER_SITE_OTHER): Payer: 59 | Admitting: Family Medicine

## 2015-06-22 ENCOUNTER — Encounter: Payer: Self-pay | Admitting: Family Medicine

## 2015-06-22 VITALS — BP 138/78 | HR 70 | Temp 98.1°F | Resp 16 | Ht 71.75 in | Wt 220.0 lb

## 2015-06-22 DIAGNOSIS — B360 Pityriasis versicolor: Secondary | ICD-10-CM | POA: Insufficient documentation

## 2015-06-22 DIAGNOSIS — Z1211 Encounter for screening for malignant neoplasm of colon: Secondary | ICD-10-CM

## 2015-06-22 DIAGNOSIS — L309 Dermatitis, unspecified: Secondary | ICD-10-CM | POA: Diagnosis not present

## 2015-06-22 DIAGNOSIS — Z1159 Encounter for screening for other viral diseases: Secondary | ICD-10-CM

## 2015-06-22 DIAGNOSIS — R030 Elevated blood-pressure reading, without diagnosis of hypertension: Secondary | ICD-10-CM | POA: Diagnosis not present

## 2015-06-22 DIAGNOSIS — Z0000 Encounter for general adult medical examination without abnormal findings: Secondary | ICD-10-CM

## 2015-06-22 MED ORDER — KETOCONAZOLE 200 MG PO TABS: 400.0000 mg | ORAL_TABLET | Freq: Every day | ORAL | Status: AC

## 2015-06-22 NOTE — Patient Instructions (Addendum)
Please contact your eyecare professional to schedule a routine eye exam   Exercise 150 minutes every week   Cut back on salty foods in diet to help lower blood pressure   Change to a moisturizing soap such as Dove, and apply Eucerin cream to skin 2-3 times a week.     DASH Eating Plan DASH stands for "Dietary Approaches to Stop Hypertension." The DASH eating plan is a healthy eating plan that has been shown to reduce high blood pressure (hypertension). Additional health benefits may include reducing the risk of type 2 diabetes mellitus, heart disease, and stroke. The DASH eating plan may also help with weight loss. WHAT DO I NEED TO KNOW ABOUT THE DASH EATING PLAN? For the DASH eating plan, you will follow these general guidelines:  Choose foods with a percent daily value for sodium of less than 5% (as listed on the food label).  Use salt-free seasonings or herbs instead of table salt or sea salt.  Check with your health care provider or pharmacist before using salt substitutes.  Eat lower-sodium products, often labeled as "lower sodium" or "no salt added."  Eat fresh foods.  Eat more vegetables, fruits, and low-fat dairy products.  Choose whole grains. Look for the word "whole" as the first word in the ingredient list.  Choose fish and skinless chicken or [place] more often than red meat. Limit fish, poultry, and meat to 6 oz (170 g) each day.  Limit sweets, desserts, sugars, and sugary drinks.  Choose heart-healthy fats.  Limit cheese to 1 oz (28 g) per day.  Eat more home-cooked food and less restaurant, buffet, and fast food.  Limit fried foods.  Cook foods using methods other than frying.  Limit canned vegetables. If you do use them, rinse them well to decrease the sodium.  When eating at a restaurant, ask that your food be prepared with less salt, or no salt if possible. WHAT FOODS CAN I EAT? Seek help from a dietitian for individual calorie  needs. Grains Whole grain or whole wheat bread. Brown rice. Whole grain or whole wheat pasta. Quinoa, bulgur, and whole grain cereals. Low-sodium cereals. Corn or whole wheat flour tortillas. Whole grain cornbread. Whole grain crackers. Low-sodium crackers. Vegetables Fresh or frozen vegetables (raw, steamed, roasted, or grilled). Low-sodium or reduced-sodium tomato and vegetable juices. Low-sodium or reduced-sodium tomato sauce and paste. Low-sodium or reduced-sodium canned vegetables.  Fruits All fresh, canned (in natural juice), or frozen fruits. Meat and Other Protein Products Ground beef (85% or leaner), grass-fed beef, or beef trimmed of fat. Skinless chicken or [place]. Ground chicken or [place]. Pork trimmed of fat. All fish and seafood. Eggs. Dried beans, peas, or lentils. Unsalted nuts and seeds. Unsalted canned beans. Dairy Low-fat dairy products, such as skim or 1% milk, 2% or reduced-fat cheeses, low-fat ricotta or cottage cheese, or plain low-fat yogurt. Low-sodium or reduced-sodium cheeses. Fats and Oils Tub margarines without trans fats. Light or reduced-fat mayonnaise and salad dressings (reduced sodium). Avocado. Safflower, olive, or canola oils. Natural peanut or almond butter. Other Unsalted popcorn and pretzels. The items listed above may not be a complete list of recommended foods or beverages. Contact your dietitian for more options. WHAT FOODS ARE NOT RECOMMENDED? Grains White bread. White pasta. White rice. Refined cornbread. Bagels and croissants. Crackers that contain trans fat. Vegetables Creamed or fried vegetables. Vegetables in a cheese sauce. Regular canned vegetables. Regular canned tomato sauce and paste. Regular tomato and vegetable juices. Fruits Dried fruits.  Canned fruit in light or heavy syrup. Fruit juice. Meat and Other Protein Products Fatty cuts of meat. Ribs, chicken wings, bacon, sausage, bologna, salami, chitterlings, fatback, hot dogs, bratwurst,  and packaged luncheon meats. Salted nuts and seeds. Canned beans with salt. Dairy Whole or 2% milk, cream, half-and-half, and cream cheese. Whole-fat or sweetened yogurt. Full-fat cheeses or blue cheese. Nondairy creamers and whipped toppings. Processed cheese, cheese spreads, or cheese curds. Condiments Onion and garlic salt, seasoned salt, table salt, and sea salt. Canned and packaged gravies. Worcestershire sauce. Tartar sauce. Barbecue sauce. Teriyaki sauce. Soy sauce, including reduced sodium. Steak sauce. Fish sauce. Oyster sauce. Cocktail sauce. Horseradish. Ketchup and mustard. Meat flavorings and tenderizers. Bouillon cubes. Hot sauce. Tabasco sauce. Marinades. Taco seasonings. Relishes. Fats and Oils Butter, stick margarine, lard, shortening, ghee, and bacon fat. Coconut, palm kernel, or palm oils. Regular salad dressings. Other Pickles and olives. Salted popcorn and pretzels. The items listed above may not be a complete list of foods and beverages to avoid. Contact your dietitian for more information. WHERE CAN I FIND MORE INFORMATION? National Heart, Lung, and Blood Institute: travelstabloid.com   This information is not intended to replace advice given to you by your health care provider. Make sure you discuss any questions you have with your health care provider.   Document Released: 03/06/2011 Document Revised: 04/07/2014 Document Reviewed: 01/19/2013 Elsevier Interactive Patient Education [employer].

## 2015-06-22 NOTE — Progress Notes (Signed)
Patient: Jeffrey Villa, Male    DOB: Aug 25, 1955, 61 y.o.   MRN: [id number] Visit Date: 06/22/2015  Today's Provider: Lelon Huh, MD   Chief Complaint  Patient presents with  . Annual Exam  . Hyperglycemia    follow up  . Blood Pressure Check    follow up   Subjective:   Re-Establish Care    Annual physical exam Jeffrey Villa is a 60 y.o. male who presents today for health maintenance and complete physical. He feels fairly well. He reports never exercising . He reports he is sleeping fairly well.  -----------------------------------------------------------------  Hyperglycemia, Follow-up:   Lab Results  Component Value Date   HGBA1C 5.3 07/19/2010   GLUCOSE 106* 03/17/2015   GLUCOSE 107* 11/26/2009    Last seen for for this 5 years ago.  Management during that visit includes no changes. Current symptoms include none and have been stable.  Weight trend: stable Prior visit with dietician: no Current diet: well balanced Current exercise: none  Pertinent Labs:    Component Value Date/Time   CHOL 177 04/16/2010   TRIG 98 04/16/2010   CREATININE 0.90 03/17/2015 1730   CREATININE 0.9 04/16/2010    Wt Readings from Last 3 Encounters:  03/26/11 212 lb (96.163 kg)  03/19/15 215 lb (97.523 kg)  03/17/15 218 lb (98.884 kg)    Elevated blood pressure follow-up:  BP Readings from Last 3 Encounters:  03/26/11 110/62  03/19/15 142/90  03/17/15 158/98    He was last seen for hypertension 5 years ago.  BP at that visit was 142/86. Management during that visit includes advising patient to continue low sodium diet and work on keeping weight down. He reports good compliance with treatment. He is not having side effects.  He is not exercising. He is adherent to low salt diet.   Outside blood pressures are 210/120. He is experiencing fatigue.  Patient denies chest pain, chest pressure/discomfort, claudication, dyspnea, exertional chest pressure/discomfort,  irregular heart beat, lower extremity edema, near-syncope, orthopnea and palpitations.   Cardiovascular risk factors include advanced age (older than 9 for men, 55 for women), male gender and sedentary lifestyle.  Use of agents associated with hypertension: none.     Weight trend: stable Wt Readings from Last 3 Encounters:  03/26/11 212 lb (96.163 kg)  03/19/15 215 lb (97.523 kg)  03/17/15 218 lb (98.884 kg)    Current diet: well balanced  ------------------------------------------------------------------------   Rash Has noticed skin has been dry and flaky for several months. Has also noticed patches of skin discoloration similar to previous episodes of Tinea Versicolor which responded well to Ketoconazole.   Review of Systems  Constitutional: Positive for fatigue. Negative for fever, chills and appetite change.  HENT: Negative for congestion, ear pain, hearing loss, nosebleeds and trouble swallowing.   Eyes: Negative for pain and visual disturbance.  Respiratory: Negative for cough, chest tightness and shortness of breath.   Cardiovascular: Negative for chest pain, palpitations and leg swelling.  Gastrointestinal: Negative for nausea, vomiting, abdominal pain, diarrhea, constipation and blood in stool.  Endocrine: Negative for polydipsia, polyphagia and polyuria.  Genitourinary: Negative for dysuria and flank pain.  Musculoskeletal: Negative for myalgias, back pain, joint swelling, arthralgias and neck stiffness.  Skin: Negative for color change, rash and wound.  Neurological: Positive for dizziness, light-headedness and headaches. Negative for tremors, seizures, speech difficulty and weakness.  Psychiatric/Behavioral: Negative for behavioral problems, confusion, sleep disturbance, dysphoric mood and decreased concentration. The patient is  not nervous/anxious.   All other systems reviewed and are negative.   Social History      He  reports that he quit smoking about 11 years  ago. He does not have any smokeless tobacco history on file. He reports that he drinks alcohol. He reports that he does not use illicit drugs.       Social History   Social History  . Marital Status: Married    Spouse Name: N/A  . Number of Children: 1  . Years of Education: N/A   Occupational History  . [employer]    Social History Main Topics  . Smoking status: Former Smoker    Quit date: 03/31/2004  . Smokeless tobacco: None  . Alcohol Use: 0.0 oz/week    0 Standard drinks or equivalent per week     Comment: moderate alcohol use  . Drug Use: No  . Sexual Activity: Not Asked   Other Topics Concern  . None   Social History Narrative    Past Medical History  Diagnosis Date  . Hypertension   . Hyperglycemia   . Arthritis      Patient Active Problem List   Diagnosis Date Noted  . Blood pressure elevated without history of HTN 04/13/2015  . Hyperglycemia 04/13/2015  . 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 Rotator cuff repair Left 11/29/2009    Done at [facility]- Dr. Susa Day  . Spirometry  07/25/2009    Normal    Family History        Family Status  Relation Status Death Age  . Mother Deceased     liver tumor vs. kidney tumor  . Father Deceased     died at an early age; very likely secondary to alcohol abuse  . Sister Alive   . Brother Alive   . Sister Alive   . Sister Alive   . Brother Alive   . Sister Alive         His family history includes Alcohol abuse in his father; Diabetes in his brother, mother, sister, and sister; Glaucoma in his mother; Hypertension in his mother.    No Known Allergies  Previous Medications   No medications on file    Patient Care Team: Birdie Sons, MD as PCP - General (Family Medicine) Susa Day, MD as Consulting Physician (Orthopedic Surgery) Julieanne Manson Leeanne Mannan., MD (Rheumatology)     Objective:   Vitals: BP 144/86 mmHg  Pulse 70   Temp(Src) 98.1 F (36.7 C) (Oral)  Resp 16  Ht 5' 11.75" (1.822 m)  Wt 220 lb (99.791 kg)  BMI 30.06 kg/m2  SpO2 98%   Physical Exam   General Appearance:    Alert, cooperative, no distress, appears stated age  Head:    Normocephalic, without obvious abnormality, atraumatic  Eyes:    PERRL, conjunctiva/corneas clear, EOM's intact, fundi    benign, both eyes       Ears:    Normal TM's and external ear canals, both ears  Nose:   Nares normal, septum midline, mucosa normal, no drainage   or sinus tenderness  Throat:   Lips, mucosa, and tongue normal; teeth and gums normal  Neck:   Supple, symmetrical, trachea midline, no adenopathy;       thyroid:  No enlargement/tenderness/nodules; no carotid   bruit or JVD  Back:     Symmetric, no curvature, ROM normal, no CVA tenderness  Lungs:  Clear to auscultation bilaterally, respirations unlabored  Chest wall:    No tenderness or deformity  Heart:    Regular rate and rhythm, S1 and S2 normal, no murmur, rub   or gallop  Abdomen:     Soft, non-tender, bowel sounds active all four quadrants,    no masses, no organomegaly  Genitalia:    deferred  Rectal:    deferred  Extremities:   Extremities normal, atraumatic, no cyanosis or edema  Pulses:   2+ and symmetric all extremities  Skin:   Skin changes as described in HPI.   Lymph nodes:   Cervical, supraclavicular, and axillary nodes normal  Neurologic:   CNII-XII intact. Normal strength, sensation and reflexes      throughout    Depression Screen PHQ 2/9 Scores 06/22/2015  PHQ - 2 Score 0  PHQ- 9 Score 3    Current Exercise Habits: The patient does not participate in regular exercise at present Exercise limited by: None identified   Assessment & Plan:     Routine Health Maintenance and Physical Exam  Exercise Activities and Dietary recommendations Goals    None      Immunization History  Administered Date(s) Administered  . Td 07/10/2004  . Tdap 12/04/2010    Health  Maintenance  Topic Date Due  . Hepatitis C Screening  December 24, 1955  . HIV Screening  09/20/1970  . COLONOSCOPY  09/19/2005  . INFLUENZA VACCINE  10/30/2014  . TETANUS/TDAP  12/03/2020      Discussed health benefits of physical activity, and encouraged him to engage in regular exercise appropriate for his age and condition.    -------------------------------------------------------------------- 1. Annual physical exam  - Comprehensive metabolic panel - Lipid panel - PSA - TSH  2. Blood pressure elevated without history of HTN D&E, avoid sodium in diet - EKG 12-Lead - TSH  3. Tinea versicolor Ketoconazole 200mg  2 tablets x 1   4. Eczema Change to moisturizing soap and apply Eucerin a few times per week.   5. Need for hepatitis C screening test  - Hepatitis C antibody  6. Colon cancer screening  - Ambulatory referral to Gastroenterology

## 2015-06-23 LAB — COMPREHENSIVE METABOLIC PANEL
A/G RATIO: 2.1 (ref 1.2–2.2)
ALT: 24 IU/L (ref 0–44)
AST: 20 IU/L (ref 0–40)
Albumin: 4.5 g/dL (ref 3.5–5.5)
Alkaline Phosphatase: 51 IU/L (ref 39–117)
BUN/Creatinine Ratio: 16 (ref 9–20)
BUN: 14 mg/dL (ref 6–24)
Bilirubin Total: 0.6 mg/dL (ref 0.0–1.2)
CALCIUM: 9.1 mg/dL (ref 8.7–10.2)
CO2: 23 mmol/L (ref 18–29)
Chloride: 101 mmol/L (ref 96–106)
Creatinine, Ser: 0.87 mg/dL (ref 0.76–1.27)
GFR, EST AFRICAN AMERICAN: 109 mL/min/{1.73_m2} (ref >59)
GFR, EST NON AFRICAN AMERICAN: 94 mL/min/{1.73_m2} (ref >59)
Globulin, Total: 2.1 g/dL (ref 1.5–4.5)
Glucose: 106 mg/dL — ABNORMAL HIGH (ref 65–99)
Potassium: 4.2 mmol/L (ref 3.5–5.2)
Sodium: 141 mmol/L (ref 134–144)
TOTAL PROTEIN: 6.6 g/dL (ref 6.0–8.5)

## 2015-06-23 LAB — LIPID PANEL
CHOL/HDL RATIO: 3.2 ratio (ref 0.0–5.0)
Cholesterol, Total: 156 mg/dL (ref 100–199)
HDL: 49 mg/dL (ref >39)
LDL CALC: 93 mg/dL (ref 0–99)
Triglycerides: 71 mg/dL (ref 0–149)
VLDL CHOLESTEROL CAL: 14 mg/dL (ref 5–40)

## 2015-06-23 LAB — HEPATITIS C ANTIBODY: HCV AB: 0.1 {s_co_ratio} (ref 0.0–0.9)

## 2015-06-23 LAB — TSH: TSH: 1.42 u[IU]/mL (ref 0.450–4.500)

## 2015-06-23 LAB — PSA: PROSTATE SPECIFIC AG, SERUM: 2.1 ng/mL (ref 0.0–4.0)

## 2015-06-25 ENCOUNTER — Telehealth: Payer: Self-pay

## 2015-06-25 NOTE — Telephone Encounter (Signed)
-----   Message from Birdie Sons, MD sent at 06/24/2015  1:05 PM EDT ----- Blood sugar, kidney functions, electrolytes and PSA are all normal. Check yearly

## 2015-06-25 NOTE — Telephone Encounter (Signed)
Left message to call back  

## 2015-06-25 NOTE — Telephone Encounter (Signed)
Advised patient as below.  

## 2016-04-20 ENCOUNTER — Ambulatory Visit
Admission: EM | Admit: 2016-04-20 | Discharge: 2016-04-20 | Disposition: A | Discharge status: Home / Self Care | Payer: 59

## 2016-04-20 DIAGNOSIS — Z0289 Encounter for other administrative examinations: Secondary | ICD-10-CM

## 2016-04-20 NOTE — ED Notes (Signed)
Pt here for post-accident urine drug screen. Employee of [employer]. Urine collected and documentation completed per policy. Pt denies any injury or need to see physician.

## 2018-08-24 ENCOUNTER — Ambulatory Visit
Admission: EM | Admit: 2018-08-24 | Discharge: 2018-08-24 | Disposition: A | Discharge status: Home / Self Care | Payer: 59 | Attending: Emergency Medicine | Admitting: Emergency Medicine

## 2018-08-24 ENCOUNTER — Encounter: Payer: Self-pay | Admitting: Emergency Medicine

## 2018-08-24 ENCOUNTER — Other Ambulatory Visit: Payer: Self-pay

## 2018-08-24 DIAGNOSIS — M79632 Pain in left forearm: Secondary | ICD-10-CM

## 2018-08-24 DIAGNOSIS — M62838 Other muscle spasm: Secondary | ICD-10-CM

## 2018-08-24 MED ORDER — CYCLOBENZAPRINE HCL 10 MG PO TABS: 10.0000 mg | ORAL_TABLET | Freq: Two times a day (BID) | ORAL | 0 refills | Status: DC | PRN

## 2018-08-24 NOTE — ED Provider Notes (Signed)
[facility] ____________________________________________  Time seen: Approximately 12:56 PM  I have reviewed the triage vital signs and the nursing notes.   HISTORY  Chief Complaint Arm Pain (left)   HPI Jeffrey Villa is a 63 y.o. male presenting for evaluation of left forearm pain.  Patient states that late last night he was sitting in the recliner with his arms down by his sides.  States he had been sitting this position for a while, and then states he quickly moved his arm and felt a pain to his left forearm described as tight and cramping.  States his left forearm remained very tight and tense.  States the left upper arm and his left hand remained fine throughout this.  Patient states he continued to move his upper arm and shoulder as well as his hand and fingers normally during the cramping pain.  States he never had any numbness, sensation changes or decreased fingers movement.  States he was unable to move his left forearm because of the pain and cramp.  Denies swelling, fall or direct injury.  Denies skin changes or insect bite.  Patient states he did apply warm compress.  States as he was coming into urgent care this morning he felt the pain much improved.  States he now has full range of motion but still has some tightness to his left forearm that sore.  Does do work around the house including yard work.  Denies any other triggers.  Denies chest pain, shortness of breath, paresthesias, fevers, recent sickness.  Right-hand-dominant.  Reports otherwise feels well.   Past Medical History:  Diagnosis Date  . Arthritis   . Hyperglycemia   . Hypertension     Patient Active Problem List   Diagnosis Date Noted  . Tinea versicolor 06/22/2015  . Blood pressure elevated without history of HTN 04/13/2015  . Hyperglycemia 04/13/2015  . 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 ROTATOR CUFF REPAIR Left 11/29/2009   Done at  [place]- Dr. Susa Day  . SPIROMETRY  07/25/2009   Normal     No current facility-administered medications for this encounter.   Current Outpatient Medications:  .  cyclobenzaprine (FLEXERIL) 10 MG tablet, Take 1 tablet (10 mg total) by mouth 2 (two) times daily as needed for muscle spasms. Do not drive while taking as can cause drowsiness, Disp: 15 tablet, Rfl: 0  Allergies Patient has no known allergies.  Family History  Problem Relation Age of Onset  . Diabetes Mother        type 2  . Hypertension Mother   . Glaucoma Mother   . Alcohol abuse Father   . Diabetes Sister        type 2   . Diabetes Brother        type 2  . Diabetes Sister        type 2    Social History Social History   Tobacco Use  . Smoking status: Former Smoker    Last attempt to quit: 03/31/2004    Years since quitting: 14.4  . Smokeless tobacco: Never Used  Substance Use Topics  . Alcohol use: Yes    Alcohol/week: 0.0 standard drinks    Comment: moderate alcohol use  . Drug use: No    Review of Systems Constitutional: No fever Cardiovascular: Denies chest pain. Respiratory: Denies shortness of breath. Gastrointestinal: No abdominal pain.   Musculoskeletal: Positive left forearm pain. Skin: Negative for rash.  ____________________________________________   PHYSICAL EXAM:  VITAL SIGNS: ED Triage Vitals  Enc Vitals Group     BP 08/24/18 1159 (!) 166/89     Pulse Rate 08/24/18 1159 69     Resp 08/24/18 1159 18     Temp 08/24/18 1159 98.4 F (36.9 C)     Temp Source 08/24/18 1159 Oral     SpO2 08/24/18 1159 98 %     Weight 08/24/18 1155 230 lb (104.3 kg)     Height 08/24/18 1155 6' (1.829 m)     Head Circumference --      Peak Flow --      Pain Score 08/24/18 1152 1     Pain Loc --      Pain Edu? --      Excl. in [place]? --     Constitutional: Alert and oriented. Well appearing and in no acute distress. ENT      Head: Normocephalic and atraumatic. Cardiovascular:  Normal rate, regular rhythm. Grossly normal heart sounds.  Good peripheral circulation. Respiratory: Normal respiratory effort without tachypnea nor retractions. Breath sounds are clear and equal bilaterally. No wheezes, rales, rhonchi. Musculoskeletal: Steady gait.  Bilateral distal radial pulses and brachial pulses equal and easily palpated.  Bilateral hand grip strong and equal.  Able to thumb touch each finger to bilateral hands. Left forearm at proximal brachialradialis muscle mild tenderness to direct palpation, able to fully extend arm as well as pronate and supinate with mild tenderness at proximal forearm, no bony tenderness, no edema, no erythema, no paresthesias, left upper extremity otherwise nontender.  Left hand normal distal sensation and capillary refill less than 2 seconds. Neurologic:  Normal speech and language. No gross focal neurologic deficits are appreciated. Speech is normal. No gait instability.  Skin:  Skin is warm, dry and intact. No rash noted. Psychiatric: Mood and affect are normal. Speech and behavior are normal. Patient exhibits appropriate insight and judgment   ___________________________________________   LABS (all labs ordered are listed, but only abnormal results are displayed)  Labs Reviewed - No data to display ____________________________________________  RADIOLOGY  No results found. ____________________________________________   PROCEDURES Procedures    INITIAL IMPRESSION / ASSESSMENT AND PLAN / ED COURSE  Pertinent labs & imaging results that were available during my care of the patient were reviewed by me and considered in my medical decision making (see chart for details).  Very well-appearing patient.  No acute distress.  Proximal forearm pain and cramping last night with movement, improved now.  Patient had no other accompanying symptoms, and the cramping pain was localized without motor impairment or pain proximally or distally of the  area.  Suspect acute localized muscular cramp.  PRN muscle relaxant as needed.  Over-the-counter Tylenol, ibuprofen, warm compresses as well as stretching.  Discussed red follow-up and return parameters and monitoring.  Rest. Discussed indication, risks and benefits of medications with patient.  Discussed follow up with Primary care physician this week. Discussed follow up and return parameters including no resolution or any worsening concerns. Patient verbalized understanding and agreed to plan.   ____________________________________________   FINAL CLINICAL IMPRESSION(S) / ED DIAGNOSES  Final diagnoses:  Left forearm pain  Muscle spasm     ED Discharge Orders         Ordered    cyclobenzaprine (FLEXERIL) 10 MG tablet  2 times daily PRN     08/24/18 1255           Note: This dictation was prepared  with Dragon dictation along with smaller phrase technology. Any transcriptional errors that result from this process are unintentional.         Marylene Land, NP 08/24/18 1311

## 2018-08-24 NOTE — Discharge Instructions (Addendum)
Take medication as prescribed. Rest. Drink plenty of fluids.  Over-the-counter Tylenol ibuprofen as needed.  Warm compresses and stretching as discussed.  Avoid repetitive motions or quick movements.  Follow up with your primary care physician this week as needed. Return to Urgent care for new or worsening concerns.

## 2018-08-24 NOTE — ED Triage Notes (Signed)
Pt c/o left arm pain and decreased ROM. He states that  He woke up and could not move his lower arm from the elbow down and the muscle was hard. He states that it is better now and he can move it. He states that the muscle is sore when he flexes it now.

## 2019-06-03 ENCOUNTER — Ambulatory Visit: Payer: Self-pay

## 2019-06-06 ENCOUNTER — Ambulatory Visit: Payer: Self-pay | Attending: Internal Medicine

## 2019-06-06 ENCOUNTER — Ambulatory Visit: Payer: Self-pay

## 2019-06-07 ENCOUNTER — Other Ambulatory Visit: Payer: Self-pay

## 2019-06-07 ENCOUNTER — Ambulatory Visit: Payer: Self-pay | Attending: Internal Medicine

## 2019-06-07 DIAGNOSIS — Z20822 Contact with and (suspected) exposure to covid-19: Secondary | ICD-10-CM | POA: Insufficient documentation

## 2019-06-08 LAB — NOVEL CORONAVIRUS, NAA: SARS-CoV-2, NAA: NOT DETECTED

## 2019-06-09 ENCOUNTER — Telehealth: Payer: Self-pay | Admitting: *Deleted

## 2019-06-09 NOTE — Telephone Encounter (Signed)
Pt given result of COVID test obtained 06/07/19; he verbalized understanding.

## 2019-09-05 ENCOUNTER — Ambulatory Visit: Payer: Self-pay | Attending: Internal Medicine

## 2019-09-05 DIAGNOSIS — Z23 Encounter for immunization: Secondary | ICD-10-CM

## 2019-09-05 NOTE — Progress Notes (Signed)
   Covid-19 Vaccination Clinic  Name:  Jeffrey Villa    MRN: [id number] DOB: [birth year]  09/05/2019  [name] was observed post Covid-19 immunization for 15 minutes without incident. He was provided with Vaccine Information Sheet and instruction to access the V-Safe system.   [name] was instructed to call 911 with any severe reactions post vaccine: Marland Kitchen Difficulty breathing  . Swelling of face and throat  . A fast heartbeat  . A bad rash all over body  . Dizziness and weakness   Immunizations Administered    Name Date Dose VIS Date Route   Pfizer COVID-19 Vaccine 09/05/2019  9:17 AM 0.3 mL 05/25/2018 Intramuscular   Manufacturer: [employer]   Lot: [id number]   [place]: [id number]

## 2019-10-11 ENCOUNTER — Ambulatory Visit: Payer: Self-pay | Attending: Internal Medicine

## 2019-10-11 DIAGNOSIS — Z23 Encounter for immunization: Secondary | ICD-10-CM

## 2019-10-11 NOTE — Progress Notes (Signed)
° °  Covid-19 Vaccination Clinic  Name:  Jeffrey Villa    MRN: [id number] DOB: [birth year]  10/11/2019  Mr. Jeffrey Villa was observed post Covid-19 immunization for 15 minutes without incident. He was provided with Vaccine Information Sheet and instruction to access the V-Safe system.   Mr. Jeffrey Villa was instructed to call 911 with any severe reactions post vaccine:  Difficulty breathing   Swelling of face and throat   A fast heartbeat   A bad rash all over body   Dizziness and weakness   Immunizations Administered    Name Date Dose VIS Date Route   Pfizer COVID-19 Vaccine 10/11/2019  8:30 AM 0.3 mL 05/25/2018 Intramuscular   Manufacturer: [place]   Lot: [id number]   [place]: [id number]

## 2020-03-02 ENCOUNTER — Ambulatory Visit (LOCAL_COMMUNITY_HEALTH_CENTER): Payer: Self-pay

## 2020-03-02 ENCOUNTER — Other Ambulatory Visit: Payer: Self-pay

## 2020-03-02 DIAGNOSIS — Z111 Encounter for screening for respiratory tuberculosis: Secondary | ICD-10-CM

## 2020-03-05 ENCOUNTER — Ambulatory Visit (LOCAL_COMMUNITY_HEALTH_CENTER): Payer: Self-pay

## 2020-03-05 ENCOUNTER — Other Ambulatory Visit: Payer: Self-pay

## 2020-03-05 DIAGNOSIS — Z111 Encounter for screening for respiratory tuberculosis: Secondary | ICD-10-CM

## 2020-03-05 LAB — TB SKIN TEST
Induration: 0 mm
TB Skin Test: NEGATIVE

## 2020-03-28 ENCOUNTER — Ambulatory Visit
Admission: EM | Admit: 2020-03-28 | Discharge: 2020-03-28 | Disposition: A | Discharge status: Home / Self Care | Payer: BLUE CROSS/BLUE SHIELD | Attending: Family Medicine | Admitting: Family Medicine

## 2020-03-28 ENCOUNTER — Other Ambulatory Visit: Payer: Self-pay

## 2020-03-28 DIAGNOSIS — Z20822 Contact with and (suspected) exposure to covid-19: Secondary | ICD-10-CM

## 2020-03-28 DIAGNOSIS — U071 COVID-19: Secondary | ICD-10-CM

## 2020-03-28 DIAGNOSIS — R059 Cough, unspecified: Secondary | ICD-10-CM | POA: Diagnosis not present

## 2020-03-28 LAB — RESP PANEL BY RT-PCR (FLU A&B, COVID) ARPGX2
Influenza A by PCR: NEGATIVE
Influenza B by PCR: NEGATIVE
SARS Coronavirus 2 by RT PCR: POSITIVE — AB

## 2020-03-28 MED ORDER — PROMETHAZINE-DM 6.25-15 MG/5ML PO SYRP: 5.0000 mL | ORAL_SOLUTION | Freq: Four times a day (QID) | ORAL | 0 refills | Status: AC | PRN

## 2020-03-28 NOTE — ED Provider Notes (Signed)
[facility]    CSN: [id number] Arrival date & time: 03/28/20  1114      History   Chief Complaint Chief Complaint  Patient presents with  . Cough    HPI Jeffrey Villa is a 64 y.o. male presenting for 2-day history of mild cough. He says he has been exposed to his brother who tested positive for COVID-19 a couple days ago.  Patient has been fully vaccinated for Covid 19.  Patient denies any other associated symptoms.  Denies any fever, fatigue, body aches, sore throat, congestion, headaches, chest pain, breathing ability, abdominal pain, and/V/D, or change in smell and taste.  He has not taken anything over-the-counter for his symptoms since he says his cough is very mild.  Post medical history significant for hypertension.  No other significant medical history.  No other complaints or concerns today.  HPI  Past Medical History:  Diagnosis Date  . Arthritis   . Hyperglycemia   . Hypertension     Patient Active Problem List   Diagnosis Date Noted  . Tinea versicolor 06/22/2015  . Blood pressure elevated without history of HTN 04/13/2015  . Hyperglycemia 04/13/2015  . 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 ROTATOR CUFF REPAIR Left 11/29/2009   Done at [place]- Dr. Susa Day  . SPIROMETRY  07/25/2009   Normal       Home Medications    Prior to Admission medications   Medication Sig Start Date End Date Taking? Authorizing Provider  promethazine-dextromethorphan (PROMETHAZINE-DM) 6.25-15 MG/5ML syrup Take 5 mLs by mouth 4 (four) times daily as needed for up to 7 days for cough. 03/28/20 04/04/20 Yes Danton Clap, PA-C  cyclobenzaprine (FLEXERIL) 10 MG tablet Take 1 tablet (10 mg total) by mouth 2 (two) times daily as needed for muscle spasms. Do not drive while taking as can cause drowsiness 08/24/18   Marylene Land, NP    Family History Family History  Problem Relation Age of Onset  .  Diabetes Mother        type 2  . Hypertension Mother   . Glaucoma Mother   . Alcohol abuse Father   . Diabetes Sister        type 2   . Diabetes Brother        type 2  . Diabetes Sister        type 2    Social History Social History   Tobacco Use  . Smoking status: Former Smoker    Quit date: 03/31/2004    Years since quitting: 16.0  . Smokeless tobacco: Never Used  Vaping Use  . Vaping Use: Never used  Substance Use Topics  . Alcohol use: Yes    Alcohol/week: 0.0 standard drinks    Comment: moderate alcohol use  . Drug use: No     Allergies   Patient has no known allergies.   Review of Systems Review of Systems  Constitutional: Negative for fatigue and fever.  HENT: Negative for congestion, rhinorrhea, sinus pressure, sinus pain and sore throat.   Respiratory: Positive for cough. Negative for shortness of breath.   Gastrointestinal: Negative for abdominal pain, diarrhea, nausea and vomiting.  Musculoskeletal: Negative for myalgias.  Neurological: Negative for weakness, light-headedness and headaches.  Hematological: Negative for adenopathy.     Physical Exam Triage Vital Signs ED Triage Vitals  Enc Vitals Group     BP 03/28/20 1325 (!) 188/101  Pulse Rate 03/28/20 1325 89     Resp 03/28/20 1325 18     Temp 03/28/20 1325 98 F (36.7 C)     Temp Source 03/28/20 1325 Oral     SpO2 03/28/20 1325 98 %     Weight --      Height --      Head Circumference --      Peak Flow --      Pain Score 03/28/20 1237 0     Pain Loc --      Pain Edu? --      Excl. in [place]? --    No data found.  Updated Vital Signs BP 140/90 (BP Location: Right Arm)   Pulse 89   Temp 98 F (36.7 C) (Oral)   Resp 18   SpO2 98%       Physical Exam Vitals and nursing note reviewed.  Constitutional:      General: He is not in acute distress.    Appearance: Normal appearance. He is well-developed and well-nourished. He is not ill-appearing or diaphoretic.  HENT:     Head:  Normocephalic and atraumatic.     Nose: Nose normal.     Mouth/Throat:     Mouth: Oropharynx is clear and moist and mucous membranes are normal.     Pharynx: Uvula midline. No oropharyngeal exudate.     Tonsils: No tonsillar abscesses.  Eyes:     General: No scleral icterus.       Right eye: No discharge.        Left eye: No discharge.     Extraocular Movements: EOM normal.     Conjunctiva/sclera: Conjunctivae normal.  Neck:     Thyroid: No thyromegaly.     Trachea: No tracheal deviation.  Cardiovascular:     Rate and Rhythm: Normal rate and regular rhythm.     Heart sounds: Normal heart sounds.  Pulmonary:     Effort: Pulmonary effort is normal. No respiratory distress.     Breath sounds: Normal breath sounds. No wheezing, rhonchi or rales.  Musculoskeletal:     Cervical back: Neck supple.  Skin:    General: Skin is warm and dry.     Findings: No rash.  Neurological:     General: No focal deficit present.     Mental Status: He is alert. Mental status is at baseline.     Motor: No weakness.     Gait: Gait normal.  Psychiatric:        Mood and Affect: Mood normal.        Behavior: Behavior normal.        Thought Content: Thought content normal.      UC Treatments / Results  Labs (all labs ordered are listed, but only abnormal results are displayed) Labs Reviewed  RESP PANEL BY RT-PCR (FLU A&B, COVID) ARPGX2 - Abnormal; Notable for the following components:      Result Value   SARS Coronavirus 2 by RT PCR POSITIVE (*)    All other components within normal limits    EKG   Radiology No results found.  Procedures Procedures (including critical care time)  Medications Ordered in UC Medications - No data to display  Initial Impression / Assessment and Plan / UC Course  I have reviewed the triage vital signs and the nursing notes.  Pertinent labs & imaging results that were available during my care of the patient were reviewed by me and considered in my medical  decision  making (see chart for details).   64 year old male with cough x2 days.  Positive Covid exposure.  All vital signs normal and stable in clinic.  BP at initially elevated but has normalized.  He is afebrile.  He is well-appearing.  Exam is benign.  Respiratory panel positive for COVID-19.  CDC guidance, isolation protocol, and ED precautions reviewed with patient.  Sent Promethazine DM to pharmacy.  Encourage increasing rest and fluids.  Contacted infusion center and gave patient information.  Advised him someone may reach out to him in the next couple of days for MAB therapy.   Final Clinical Impressions(s) / UC Diagnoses   Final diagnoses:  COVID-19  Cough  Exposure to COVID-19 virus     Discharge Instructions     COVID test is positive! Isolate 8 more days.   If symptomatic, go home and rest. Push fluids. Take Tylenol as needed for discomfort. Gargle warm salt water. Throat lozenges. Take Mucinex DM or Robitussin for cough. Humidifier in bedroom to ease coughing. Warm showers. Also review the COVID handout for more information.  COVID-19 INFECTION: The incubation period of COVID-19 is approximately 14 days after exposure, with most symptoms developing in roughly 4-5 days. Symptoms may range in severity from mild to critically severe. Roughly 80% of those infected will have mild symptoms. People of any age may become infected with COVID-19 and have the ability to transmit the virus. The most common symptoms include: fever, fatigue, cough, body aches, headaches, sore throat, nasal congestion, shortness of breath, nausea, vomiting, diarrhea, changes in smell and/or taste.    COURSE OF ILLNESS Some patients may begin with mild disease which can progress quickly into critical symptoms. If your symptoms are worsening please call ahead to the Emergency Department and proceed there for further treatment. Recovery time appears to be roughly 1-2 weeks for mild symptoms and 3-6 weeks for  severe disease.   GO IMMEDIATELY TO ER FOR FEVER YOU ARE UNABLE TO GET DOWN WITH TYLENOL, BREATHING PROBLEMS, CHEST PAIN, FATIGUE, LETHARGY, INABILITY TO EAT OR DRINK, ETC  QUARANTINE AND ISOLATION: To help decrease the spread of COVID-19 please remain isolated if you have COVID infection or are highly suspected to have COVID infection. This means -stay home and isolate to one room in the home if you live with others. Do not share a bed or bathroom with others while ill, sanitize and wipe down all countertops and keep common areas clean and disinfected. You may discontinue isolation if you have a mild case and are asymptomatic 10 days after symptom onset as [place] as you have been fever free >24 hours without having to take Motrin or Tylenol. If your case is more severe (meaning you develop pneumonia or are admitted in the hospital), you may have to isolate longer.   If you have been in close contact (within 6 feet) of someone diagnosed with COVID 19, you are advised to quarantine in your home for 14 days as symptoms can develop anywhere from 2-14 days after exposure to the virus. If you develop symptoms, you  must isolate.  Most current guidelines for COVID after exposure -isolate 10 days if you ARE NOT tested for COVID as [place] as symptoms do not develop -isolate 7 days if you are tested and remain asymptomatic -You do not necessarily need to be tested for COVID if you have + exposure and        develop   symptoms. Just isolate at home x10 days from symptom onset During  this global pandemic, CDC advises to practice social distancing, try to stay at least 56ft away from others at all times. Wear a face covering. Wash and sanitize your hands regularly and avoid going anywhere that is not necessary.  KEEP IN MIND THAT THE COVID TEST IS NOT 100% ACCURATE AND YOU SHOULD STILL DO EVERYTHING TO PREVENT POTENTIAL SPREAD OF VIRUS TO OTHERS (WEAR MASK, WEAR GLOVES, [place] HANDS AND SANITIZE REGULARLY). IF INITIAL  TEST IS NEGATIVE, THIS MAY NOT MEAN YOU ARE DEFINITELY NEGATIVE. MOST ACCURATE TESTING IS DONE 5-7 DAYS AFTER EXPOSURE.   It is not advised by CDC to get re-tested after receiving a positive COVID test since you can still test positive for weeks to months after you have already cleared the virus.   *If you have not been vaccinated for COVID, I strongly suggest you consider getting vaccinated as [place] as there are no contraindications.      ED Prescriptions    Medication Sig Dispense Auth. Provider   promethazine-dextromethorphan (PROMETHAZINE-DM) 6.25-15 MG/5ML syrup Take 5 mLs by mouth 4 (four) times daily as needed for up to 7 days for cough. 150 mL Danton Clap, PA-C     PDMP not reviewed this encounter.   Danton Clap, PA-C 03/28/20 1422

## 2020-03-28 NOTE — Discharge Instructions (Signed)
COVID test is positive! Isolate 8 more days.   If symptomatic, go home and rest. Push fluids. Take Tylenol as needed for discomfort. Gargle warm salt water. Throat lozenges. Take Mucinex DM or Robitussin for cough. Humidifier in bedroom to ease coughing. Warm showers. Also review the COVID handout for more information.  COVID-19 INFECTION: The incubation period of COVID-19 is approximately 14 days after exposure, with most symptoms developing in roughly 4-5 days. Symptoms may range in severity from mild to critically severe. Roughly 80% of those infected will have mild symptoms. People of any age may become infected with COVID-19 and have the ability to transmit the virus. The most common symptoms include: fever, fatigue, cough, body aches, headaches, sore throat, nasal congestion, shortness of breath, nausea, vomiting, diarrhea, changes in smell and/or taste.    COURSE OF ILLNESS Some patients may begin with mild disease which can progress quickly into critical symptoms. If your symptoms are worsening please call ahead to the Emergency Department and proceed there for further treatment. Recovery time appears to be roughly 1-2 weeks for mild symptoms and 3-6 weeks for severe disease.   GO IMMEDIATELY TO ER FOR FEVER YOU ARE UNABLE TO GET DOWN WITH TYLENOL, BREATHING PROBLEMS, CHEST PAIN, FATIGUE, LETHARGY, INABILITY TO EAT OR DRINK, ETC  QUARANTINE AND ISOLATION: To help decrease the spread of COVID-19 please remain isolated if you have COVID infection or are highly suspected to have COVID infection. This means -stay home and isolate to one room in the home if you live with others. Do not share a bed or bathroom with others while ill, sanitize and wipe down all countertops and keep common areas clean and disinfected. You may discontinue isolation if you have a mild case and are asymptomatic 10 days after symptom onset as [place] as you have been fever free >24 hours without having to take Motrin or Tylenol.  If your case is more severe (meaning you develop pneumonia or are admitted in the hospital), you may have to isolate longer.   If you have been in close contact (within 6 feet) of someone diagnosed with COVID 19, you are advised to quarantine in your home for 14 days as symptoms can develop anywhere from 2-14 days after exposure to the virus. If you develop symptoms, you  must isolate.  Most current guidelines for COVID after exposure -isolate 10 days if you ARE NOT tested for COVID as [place] as symptoms do not develop -isolate 7 days if you are tested and remain asymptomatic -You do not necessarily need to be tested for COVID if you have + exposure and        develop   symptoms. Just isolate at home x10 days from symptom onset During this global pandemic, CDC advises to practice social distancing, try to stay at least 56ft away from others at all times. Wear a face covering. Wash and sanitize your hands regularly and avoid going anywhere that is not necessary.  KEEP IN MIND THAT THE COVID TEST IS NOT 100% ACCURATE AND YOU SHOULD STILL DO EVERYTHING TO PREVENT POTENTIAL SPREAD OF VIRUS TO OTHERS (WEAR MASK, WEAR GLOVES, WASH HANDS AND SANITIZE REGULARLY). IF INITIAL TEST IS NEGATIVE, THIS MAY NOT MEAN YOU ARE DEFINITELY NEGATIVE. MOST ACCURATE TESTING IS DONE 5-7 DAYS AFTER EXPOSURE.   It is not advised by CDC to get re-tested after receiving a positive COVID test since you can still test positive for weeks to months after you have already cleared the virus.   *  If you have not been vaccinated for COVID, I strongly suggest you consider getting vaccinated as [place] as there are no contraindications.

## 2020-03-28 NOTE — ED Triage Notes (Signed)
Reports mild cough x 2 days.  Denies fever, HA, body aches, etc.    Possible exposure to COVID on Sunday.  Has had vaccines.

## 2020-05-25 DIAGNOSIS — I10 Essential (primary) hypertension: Secondary | ICD-10-CM | POA: Insufficient documentation

## 2020-06-16 LAB — COLOGUARD: COLOGUARD: POSITIVE — AB

## 2020-11-28 ENCOUNTER — Other Ambulatory Visit: Payer: Self-pay | Admitting: General Surgery

## 2020-11-28 NOTE — Progress Notes (Signed)
Subjective:     Patient ID: Jeffrey Villa is a 65 y.o. male.   HPI   The following portions of the patient's history were reviewed and updated as appropriate.   This an established patient is here today for: office visit. He is here for a positive Cologuard test and left inguinal hernia. He states that 2 months ago her was getting off the lawn mower and felt a "pop". He states the knot comes and goes, denies pain.   Review of Systems  Constitutional: Negative for chills and fever.  Respiratory: Negative for cough.          Chief Complaint  Patient presents with   Hernia      BP (!) 140/80   Ht 177.8 cm (_0 )   BMI 30.99 kg/m BMI unchanged from March 2022.       Past Medical History:  Diagnosis Date   Arthritis     Hyperglycemia     Hypertension             Past Surgical History:  Procedure Laterality Date   ARTHROSCOPIC ROTATOR CUFF REPAIR Left 11/29/2009    Dr. Susa Day- [place]   spirometry   07/25/2009    normal          Social History           Socioeconomic History   Marital status: Single  Tobacco Use   Smoking status: Former Smoker      Types: Cigarettes      Quit date: 03/31/2004      Years since quitting: 16.6   Smokeless tobacco: Never Used  Substance and Sexual Activity   Alcohol use: Yes   Drug use: Not Currently        No Known Allergies   Current Medications        Current Outpatient Medications  Medication Sig Dispense Refill   bisoproloL-hydrochlorothiazide (ZIAC) 2.5-6.25 mg tablet Take 1 tablet by mouth once daily 90 tablet 3   tamsulosin (FLOMAX) 0.4 mg capsule Take 1 capsule (0.4 mg total) by mouth once daily Take 30 minutes after same meal each day. 90 capsule 3    No current facility-administered medications for this visit.             Family History  Problem Relation Age of Onset   High blood pressure (Hypertension) Mother     Diabetes Mother     Pancreatic cancer Mother     Glaucoma Mother     Alcohol  abuse Father     Diabetes Sister     Diabetes Sister     No Known Problems Sister     No Known Problems Sister     Diabetes Brother     No Known Problems Brother             Objective:   Physical Exam Constitutional:      Appearance: Normal appearance.  Abdominal:     Tenderness: There is no abdominal tenderness.     Hernia: A hernia is present. Hernia is present in the left inguinal area. There is no hernia in the umbilical area.       Comments: Partially reducible epigastric/umbilical hernia.  Reducible left inguinal hernia.  Right groin intact.  Skin:    General: Skin is warm and dry.  Neurological:     Mental Status: He is alert and oriented to person, place, and time.  Psychiatric:        Mood and Affect:  Mood normal.        Behavior: Behavior normal.        Labs and Radiology:    August 2022 laboratory:   Component Ref Range & Units 2 wk ago   Hemoglobin A1C 4.2 - 5.6 % 5.7 High    Average Blood Glucose (Calc) mg/dL 117     WBC (White Blood Cell Count) 4.1 - 10.2 10^3/uL 6.6   RBC (Red Blood Cell Count) 4.69 - 6.13 10^6/uL 4.65 Low    Hemoglobin 14.1 - 18.1 gm/dL 15.3   Hematocrit 40.0 - 52.0 % 43.1   MCV (Mean Corpuscular Volume) 80.0 - 100.0 fl 92.7   MCH (Mean Corpuscular Hemoglobin) 27.0 - 31.2 pg 32.9 High    MCHC (Mean Corpuscular Hemoglobin Concentration) 32.0 - 36.0 gm/dL 35.5   Platelet Count 150 - 450 10^3/uL 278   RDW-CV (Red Cell Distribution Width) 11.6 - 14.8 % 13.5   MPV (Mean Platelet Volume) 9.4 - 12.4 fl 9.5   Neutrophils 1.50 - 7.80 10^3/uL 4.43   Lymphocytes 1.00 - 3.60 10^3/uL 1.46   Monocytes 0.00 - 1.50 10^3/uL 0.52   Eosinophils 0.00 - 0.55 10^3/uL 0.09   Basophils 0.00 - 0.09 10^3/uL 0.05   Neutrophil % 32.0 - 70.0 % 67.4   Lymphocyte % 10.0 - 50.0 % 22.3   Monocyte % 4.0 - 13.0 % 7.9   Eosinophil % 1.0 - 5.0 % 1.4   Basophil% 0.0 - 2.0 % 0.8   Immature Granulocyte % <=0.7 % 0.2   Immature Granulocyte Count <=0.06 10^3/L  0.01     Glucose 70 - 110 mg/dL 109   Sodium 136 - 145 mmol/L 141   Potassium 3.6 - 5.1 mmol/L 4.3   Chloride 97 - 109 mmol/L 105   Carbon Dioxide (CO2) 22.0 - 32.0 mmol/L 31.3   Urea Nitrogen (BUN) 7 - 25 mg/dL 21   Creatinine 0.7 - 1.3 mg/dL 0.9   Glomerular Filtration Rate (eGFR), MDRD Estimate >60 mL/min/1.73sq m 103   Calcium 8.7 - 10.3 mg/dL 9.4   AST  8 - 39 U/L 19   ALT  6 - 57 U/L 24   Alk Phos (alkaline Phosphatase) 34 - 104 U/L 49   Albumin 3.5 - 4.8 g/dL 4.4   Bilirubin, Total 0.3 - 1.2 mg/dL 1.2   Protein, Total 6.1 - 7.9 g/dL 6.5   A/G Ratio 1.0 - 5.0 gm/dL 2.1       June 07, 2020 Cologuard result:   Ref Range & Units 5 mo ago  Test Result Negative Positive Abnormal           Assessment:     Previously identified umbilical/epigastric hernia.  Mild increase in size since initial exam in March 2022.   New left inguinal hernia.   Positive Cologuard exam.    Plan:     Indications for colonoscopy were reviewed.  Son, and have an otherwise healthy male have 2 hernias develop within the course of a year, but he is still very active and it may be nothing related to increased abdominal pressure.   The patient reports no change in bowel or bladder function to suggest an intra-abdominal process.      This note is partially prepared by Karie Fetch, RN, acting as a scribe in the presence of Dr. Hervey Ard, MD.  The documentation recorded by the scribe accurately reflects the service I personally performed and the decisions made by me.    Robert Bellow, MD  FACS

## 2021-01-01 ENCOUNTER — Encounter: Payer: Self-pay | Admitting: General Surgery

## 2021-01-02 ENCOUNTER — Encounter
Admission: RE | Disposition: A | Discharge status: Home / Self Care | Payer: Self-pay | Source: Home / Self Care | Attending: General Surgery

## 2021-01-02 ENCOUNTER — Ambulatory Visit: Payer: Medicare HMO | Admitting: Registered Nurse

## 2021-01-02 ENCOUNTER — Encounter: Payer: Self-pay | Admitting: General Surgery

## 2021-01-02 ENCOUNTER — Ambulatory Visit
Admission: RE | Admit: 2021-01-02 | Discharge: 2021-01-02 | Disposition: A | Discharge status: Home / Self Care | Payer: Medicare HMO | Attending: General Surgery | Admitting: General Surgery

## 2021-01-02 DIAGNOSIS — D123 Benign neoplasm of transverse colon: Secondary | ICD-10-CM | POA: Diagnosis not present

## 2021-01-02 DIAGNOSIS — D125 Benign neoplasm of sigmoid colon: Secondary | ICD-10-CM | POA: Insufficient documentation

## 2021-01-02 DIAGNOSIS — D120 Benign neoplasm of cecum: Secondary | ICD-10-CM | POA: Insufficient documentation

## 2021-01-02 DIAGNOSIS — Z79899 Other long term (current) drug therapy: Secondary | ICD-10-CM | POA: Diagnosis not present

## 2021-01-02 DIAGNOSIS — D122 Benign neoplasm of ascending colon: Secondary | ICD-10-CM | POA: Insufficient documentation

## 2021-01-02 DIAGNOSIS — F17290 Nicotine dependence, other tobacco product, uncomplicated: Secondary | ICD-10-CM | POA: Diagnosis not present

## 2021-01-02 DIAGNOSIS — R195 Other fecal abnormalities: Secondary | ICD-10-CM | POA: Diagnosis present

## 2021-01-02 HISTORY — PX: COLONOSCOPY WITH PROPOFOL: SHX5780

## 2021-01-02 SURGERY — COLONOSCOPY WITH PROPOFOL
Anesthesia: General

## 2021-01-02 MED ORDER — DEXMEDETOMIDINE (PRECEDEX) IN NS 20 MCG/5ML (4 MCG/ML) IV SYRINGE
PREFILLED_SYRINGE | INTRAVENOUS | Status: DC | PRN
  Administered 2021-01-02: 8 ug via INTRAVENOUS

## 2021-01-02 MED ORDER — PROPOFOL 500 MG/50ML IV EMUL
INTRAVENOUS | Status: DC | PRN
  Administered 2021-01-02: 150 ug/kg/min via INTRAVENOUS

## 2021-01-02 MED ORDER — SODIUM CHLORIDE 0.9 % IV SOLN: INTRAVENOUS | Status: DC

## 2021-01-02 MED ORDER — PHENYLEPHRINE HCL (PRESSORS) 10 MG/ML IV SOLN
INTRAVENOUS | Status: DC | PRN
  Administered 2021-01-02: 100 ug via INTRAVENOUS
  Administered 2021-01-02: 50 ug via INTRAVENOUS
  Administered 2021-01-02: 100 ug via INTRAVENOUS
  Administered 2021-01-02 (×2): 150 ug via INTRAVENOUS

## 2021-01-02 MED ORDER — EPHEDRINE SULFATE 50 MG/ML IJ SOLN
INTRAMUSCULAR | Status: DC | PRN
  Administered 2021-01-02: 5 mg via INTRAVENOUS
  Administered 2021-01-02 (×2): 10 mg via INTRAVENOUS

## 2021-01-02 MED ORDER — PROPOFOL 10 MG/ML IV BOLUS
INTRAVENOUS | Status: DC | PRN
Start: 2021-01-02 — End: 2021-01-02
  Administered 2021-01-02: 90 mg via INTRAVENOUS
  Administered 2021-01-02: 10 mg via INTRAVENOUS
  Administered 2021-01-02: 30 mg via INTRAVENOUS

## 2021-01-02 MED ORDER — LIDOCAINE HCL (CARDIAC) PF 100 MG/5ML IV SOSY
PREFILLED_SYRINGE | INTRAVENOUS | Status: DC | PRN
  Administered 2021-01-02: 100 mg via INTRAVENOUS

## 2021-01-02 NOTE — Op Note (Signed)
[facility] Gastroenterology Patient Name: Jeffrey Villa Procedure Date: 01/02/2021 9:44 AM MRN: [id number] Account #: [id number] Date of Birth: 01/06/1956 Admit Type: Outpatient Age: 64 Room: [facility] ENDO ROOM 1 Gender: Male Note Status: Finalized Instrument Name: Peds Colonoscope [id number] Procedure:             Colonoscopy Indications:           Positive Cologuard test Providers:             Robert Bellow, MD Medicines:             Propofol per Anesthesia Complications:         No immediate complications. Procedure:             Pre-Anesthesia Assessment:                        - Prior to the procedure, a History and Physical was                         performed, and patient medications, allergies and                         sensitivities were reviewed. The patient's tolerance                         of previous anesthesia was reviewed.                        - The risks and benefits of the procedure and the                         sedation options and risks were discussed with the                         patient. All questions were answered and informed                         consent was obtained.                        After obtaining informed consent, the colonoscope was                         passed under direct vision. Throughout the procedure,                         the patient's blood pressure, pulse, and oxygen                         saturations were monitored continuously. The                         Colonoscope was introduced through the anus and                         advanced to the the cecum, identified by appendiceal                         orifice and ileocecal valve. The colonoscopy was  performed without difficulty. The patient tolerated                         the procedure well. The quality of the bowel                         preparation was excellent. Findings:      Five sessile polyps were found in the sigmoid  colon, splenic flexure and       ascending colon. The polyps were 5 to 10 mm in size. These polyps were       removed with a hot snare. Resection and retrieval were complete.      Two sessile polyps were found in the ascending colon and cecum. The       polyps were 5 mm in size. These polyps were removed with a cold biopsy       forceps. Resection and retrieval were complete.      The retroflexed view of the distal rectum and anal verge was normal and       showed no anal or rectal abnormalities. Impression:            - Five 5 to 10 mm polyps in the sigmoid colon, at the                         splenic flexure and in the ascending colon, removed                         with a hot snare. Resected and retrieved.                        - Two 5 mm polyps in the ascending colon and in the                         cecum, removed with a cold biopsy forceps. Resected                         and retrieved.                        - The distal rectum and anal verge are normal on                         retroflexion view. Recommendation:        - Telephone endoscopist for pathology results in 1                         week. Procedure Code(s):     --- Professional ---                        [id number], Colonoscopy, flexible; with removal of                         tumor(s), polyp(s), or other lesion(s) by snare                         technique                        97588, 59, Colonoscopy,  flexible; with biopsy, single                         or multiple Diagnosis Code(s):     --- Professional ---                        K63.5, Polyp of colon                        R19.5, Other fecal abnormalities CPT copyright 2019 American Medical Association. All rights reserved. The codes documented in this report are preliminary and upon coder review may  be revised to meet current compliance requirements. Robert Bellow, MD 01/02/2021 10:30:22 AM This report has been signed electronically. Number of Addenda:  0 Note Initiated On: 01/02/2021 9:44 AM Scope Withdrawal Time: 0 hours 29 minutes 49 seconds  Total Procedure Duration: 0 hours 33 minutes 46 seconds  Estimated Blood Loss:  Estimated blood loss: none.      [facility]

## 2021-01-02 NOTE — Transfer of Care (Signed)
Immediate Anesthesia Transfer of Care Note  Patient: Jeffrey Villa  Procedure(s) Performed: COLONOSCOPY WITH PROPOFOL  Patient Location: Endoscopy Unit  Anesthesia Type:General  Level of Consciousness: drowsy  Airway & Oxygen Therapy: Patient Spontanous Breathing  Post-op Assessment: Report given to RN and Post -op Vital signs reviewed and stable  Post vital signs: Reviewed and stable  Last Vitals:  Vitals Value Taken Time  BP 101/65 01/02/21 1033  Temp    Pulse 76 01/02/21 1033  Resp 19 01/02/21 1033  SpO2 99 % 01/02/21 1033  Vitals shown include unvalidated device data.  Last Pain:  Vitals:   01/02/21 0918  TempSrc: Temporal         Complications: No notable events documented.

## 2021-01-02 NOTE — Anesthesia Preprocedure Evaluation (Signed)
Anesthesia Evaluation  Patient identified by MRN, date of birth, ID band Patient awake    Reviewed: Allergy & Precautions, NPO status , Patient's Chart, lab work & pertinent test results  History of Anesthesia Complications Negative for: history of anesthetic complications  Airway Mallampati: II  TM Distance: >3 FB Neck ROM: Full    Dental  (+) Poor Dentition   Pulmonary neg sleep apnea, neg COPD, Current Smoker and Patient abstained from smoking.,    breath sounds clear to auscultation- rhonchi (-) wheezing      Cardiovascular Exercise Tolerance: Good hypertension, Pt. on medications (-) CAD, (-) Past MI, (-) Cardiac Stents and (-) CABG  Rhythm:Regular Rate:Normal - Systolic murmurs and - Diastolic murmurs    Neuro/Psych neg Seizures negative neurological ROS  negative psych ROS   GI/Hepatic negative GI ROS, Neg liver ROS,   Endo/Other  negative endocrine ROSneg diabetes  Renal/GU negative Renal ROS     Musculoskeletal  (+) Arthritis ,   Abdominal (+) + obese,   Peds  Hematology negative hematology ROS (+)   Anesthesia Other Findings Past Medical History: No date: Arthritis No date: Hyperglycemia No date: Hypertension   Reproductive/Obstetrics                             Anesthesia Physical Anesthesia Plan  ASA: 2  Anesthesia Plan: General   Post-op Pain Management:    Induction: Intravenous  PONV Risk Score and Plan: 0 and Propofol infusion  Airway Management Planned: Natural Airway  Additional Equipment:   Intra-op Plan:   Post-operative Plan:   Informed Consent: I have reviewed the patients History and Physical, chart, labs and discussed the procedure including the risks, benefits and alternatives for the proposed anesthesia with the patient or authorized representative who has indicated his/her understanding and acceptance.     Dental advisory given  Plan  Discussed with: CRNA and Anesthesiologist  Anesthesia Plan Comments:         Anesthesia Quick Evaluation

## 2021-01-02 NOTE — H&P (Signed)
Jeffrey Villa [id number] 03-16-1956     HPI: Active 65 y/o with a positive cologuard.  For colonoscopy.  Tolerated prep well.  Was cooking at the day care center yesterday AM and found the [place] irresistible. Reports only eating a few.    Medications Prior to Admission  Medication Sig Dispense Refill Last Dose   bisoprolol-hydrochlorothiazide (ZIAC) 2.5-6.25 MG tablet Take 1 tablet by mouth daily.   01/01/2021   tamsulosin (FLOMAX) 0.4 MG CAPS capsule Take 0.4 mg by mouth daily.   01/01/2021   cyclobenzaprine (FLEXERIL) 10 MG tablet Take 1 tablet (10 mg total) by mouth 2 (two) times daily as needed for muscle spasms. Do not drive while taking as can cause drowsiness (Patient not taking: Reported on 01/01/2021) 15 tablet 0 Not Taking   No Known Allergies Past Medical History:  Diagnosis Date   Arthritis    Hyperglycemia    Hypertension    Past Surgical History:  Procedure Laterality Date   ROTATOR CUFF REPAIR Left 11/29/2009   Done at [facility]- Dr. Susa Day   SPIROMETRY  07/25/2009   Normal   Social History   Socioeconomic History   Marital status: Married    Spouse name: Not on file   Number of children: 1   Years of education: Not on file   Highest education level: Not on file  Occupational History   Occupation: [employer]  Tobacco Use   Smoking status: Every Day    Types: Cigars   Smokeless tobacco: Never  Vaping Use   Vaping Use: Never used  Substance and Sexual Activity   Alcohol use: Yes    Alcohol/week: 0.0 standard drinks    Comment: moderate alcohol use   Drug use: No   Sexual activity: Not on file  Other Topics Concern   Not on file  Social History Narrative   Not on file   Social Determinants of Health   Financial Resource Strain: Not on file  Food Insecurity: Not on file  Transportation Needs: Not on file  Physical Activity: Not on file  Stress: Not on file  Social Connections: Not on file  Intimate Partner Violence: Not on file   Social  History   Social History Narrative   Not on file     ROS: Negative.     PE: HEENT: Negative. Lungs: Clear. Cardio: RR.  Assessment/Plan:  Proceed with planned endoscopy.   Jeffrey Villa Jeffrey Villa 01/02/2021

## 2021-01-02 NOTE — Anesthesia Postprocedure Evaluation (Signed)
Anesthesia Post Note  Patient: Jeffrey Villa  Procedure(s) Performed: COLONOSCOPY WITH PROPOFOL  Patient location during evaluation: Endoscopy Anesthesia Type: General Level of consciousness: awake and alert and oriented Pain management: pain level controlled Vital Signs Assessment: post-procedure vital signs reviewed and stable Respiratory status: spontaneous breathing, nonlabored ventilation and respiratory function stable Cardiovascular status: blood pressure returned to baseline and stable Postop Assessment: no signs of nausea or vomiting Anesthetic complications: no   No notable events documented.   Last Vitals:  Vitals:   01/02/21 1050 01/02/21 1100  BP: (!) 116/59 (!) 152/79  Pulse: (!) 58 69  Resp: 17 14  Temp:    SpO2: 100% 100%    Last Pain:  Vitals:   01/02/21 0918  TempSrc: Temporal                 Dennie Vecchio

## 2021-01-03 ENCOUNTER — Encounter: Payer: Self-pay | Admitting: General Surgery

## 2021-01-03 LAB — SURGICAL PATHOLOGY

## 2021-12-18 DIAGNOSIS — N138 Other obstructive and reflux uropathy: Secondary | ICD-10-CM | POA: Insufficient documentation

## 2023-03-19 DIAGNOSIS — M25562 Pain in left knee: Secondary | ICD-10-CM | POA: Diagnosis not present

## 2023-03-19 DIAGNOSIS — R972 Elevated prostate specific antigen [PSA]: Secondary | ICD-10-CM | POA: Diagnosis not present

## 2023-03-19 DIAGNOSIS — R7303 Prediabetes: Secondary | ICD-10-CM | POA: Diagnosis not present

## 2023-03-19 DIAGNOSIS — Z0000 Encounter for general adult medical examination without abnormal findings: Secondary | ICD-10-CM | POA: Diagnosis not present

## 2023-03-19 DIAGNOSIS — Z13220 Encounter for screening for lipoid disorders: Secondary | ICD-10-CM | POA: Diagnosis not present

## 2023-03-19 DIAGNOSIS — Z23 Encounter for immunization: Secondary | ICD-10-CM | POA: Diagnosis not present

## 2023-03-19 DIAGNOSIS — I10 Essential (primary) hypertension: Secondary | ICD-10-CM | POA: Diagnosis not present

## 2023-03-19 DIAGNOSIS — Z79899 Other long term (current) drug therapy: Secondary | ICD-10-CM | POA: Diagnosis not present

## 2023-03-19 DIAGNOSIS — N400 Enlarged prostate without lower urinary tract symptoms: Secondary | ICD-10-CM | POA: Diagnosis not present

## 2023-03-31 DIAGNOSIS — M1712 Unilateral primary osteoarthritis, left knee: Secondary | ICD-10-CM | POA: Diagnosis not present

## 2023-03-31 DIAGNOSIS — M25562 Pain in left knee: Secondary | ICD-10-CM | POA: Diagnosis not present

## 2023-05-13 ENCOUNTER — Ambulatory Visit (INDEPENDENT_AMBULATORY_CARE_PROVIDER_SITE_OTHER): Payer: No Typology Code available for payment source | Admitting: Podiatry

## 2023-05-13 ENCOUNTER — Ambulatory Visit (INDEPENDENT_AMBULATORY_CARE_PROVIDER_SITE_OTHER): Payer: No Typology Code available for payment source

## 2023-05-13 ENCOUNTER — Encounter: Payer: Self-pay | Admitting: Podiatry

## 2023-05-13 DIAGNOSIS — M19072 Primary osteoarthritis, left ankle and foot: Secondary | ICD-10-CM | POA: Diagnosis not present

## 2023-05-13 DIAGNOSIS — M19071 Primary osteoarthritis, right ankle and foot: Secondary | ICD-10-CM

## 2023-05-13 DIAGNOSIS — M778 Other enthesopathies, not elsewhere classified: Secondary | ICD-10-CM

## 2023-05-13 DIAGNOSIS — M7751 Other enthesopathy of right foot: Secondary | ICD-10-CM

## 2023-05-13 MED ORDER — MELOXICAM 15 MG PO TABS
15.0000 mg | ORAL_TABLET | Freq: Every day | ORAL | 3 refills | Status: AC
Start: 2023-05-13 — End: ?

## 2023-05-13 NOTE — Progress Notes (Signed)
  Subjective:  Patient ID: Jeffrey Villa, male    DOB: [birth year],  MRN: [id number] HPI Chief Complaint  Patient presents with   Foot Pain    Dorsal foot/ankle bilateral (R>L) - burning in right x 6 weeks, tingling in left x few weeks, has knee trouble left side (PCP xrayed-said had arthritis and probably what's going on with feet), tried tart cherry   New Patient (Initial Visit)    68 y.o. male presents with the above complaint.   ROS: Denies fever chills nausea vomit muscle aches pains calf pain back pain chest pain shortness of breath.  Past Medical History:  Diagnosis Date   Arthritis    Hyperglycemia    Hypertension    Past Surgical History:  Procedure Laterality Date   COLONOSCOPY WITH PROPOFOL N/A 01/02/2021   Procedure: COLONOSCOPY WITH PROPOFOL;  Surgeon: Earline Mayotte, MD;  Location: [facility] ENDOSCOPY;  Service: Endoscopy;  Laterality: N/A;   ROTATOR CUFF REPAIR Left 11/29/2009   Done at [facility]- Dr. Jene Every   SPIROMETRY  07/25/2009   Normal    Current Outpatient Medications:    meloxicam (MOBIC) 15 MG tablet, Take 1 tablet (15 mg total) by mouth daily., Disp: 90 tablet, Rfl: 3   bisoprolol-hydrochlorothiazide (ZIAC) 2.5-6.25 MG tablet, Take 1 tablet by mouth daily., Disp: , Rfl:   No Known Allergies Review of Systems Objective:  There were no vitals filed for this visit.  General: Well developed, nourished, in no acute distress, alert and oriented x3   Dermatological: Skin is warm, dry and supple bilateral. Nails x 10 are well maintained; remaining integument appears unremarkable at this time. There are no open sores, no preulcerative lesions, no rash or signs of infection present.  Vascular: Dorsalis Pedis artery and Posterior Tibial artery pedal pulses are 2/4 bilateral with immedate capillary fill time. Pedal hair growth present. No varicosities and no lower extremity edema present bilateral.   Neruologic: Grossly intact via light touch bilateral.  Vibratory intact via tuning fork bilateral. Protective threshold with Semmes Wienstein monofilament intact to all pedal sites bilateral. Patellar and Achilles deep tendon reflexes 2+ bilateral. No Babinski or clonus noted bilateral.   Musculoskeletal: No gross boney pedal deformities bilateral. No pain, crepitus, or limitation noted with foot and ankle range of motion bilateral. Muscular strength 5/5 in all groups tested bilateral.  Mild pes planovalgus bilateral foot pain on palpation anterior medial ankle gutter right over left soft tissue swelling anterior ankle right left is minimal.  He does have some tenderness on palpation overlying the tarsometatarsal joints right foot as opposed to the left.  Gait: Unassisted, Nonantalgic.    Radiographs:  Radiographs taken today bilateral foot demonstrates good bone mineralization osteoarthritis of the tarsometatarsal joints.  Cannot rule out osteochondral defect to the medial gutter right foot.  Similar midfoot findings left plantar distally oriented calcaneal heel spurs bilaterally but no acute findings.  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  We will follow-up with him on an as-needed basis.     Ranen Doolin T. [place], [place]

## 2023-07-14 DIAGNOSIS — Z008 Encounter for other general examination: Secondary | ICD-10-CM | POA: Diagnosis not present

## 2023-07-14 DIAGNOSIS — R7303 Prediabetes: Secondary | ICD-10-CM | POA: Diagnosis not present

## 2023-07-14 DIAGNOSIS — Z6832 Body mass index (BMI) 32.0-32.9, adult: Secondary | ICD-10-CM | POA: Diagnosis not present

## 2023-07-14 DIAGNOSIS — E669 Obesity, unspecified: Secondary | ICD-10-CM | POA: Diagnosis not present

## 2023-07-14 DIAGNOSIS — I10 Essential (primary) hypertension: Secondary | ICD-10-CM | POA: Diagnosis not present

## 2023-09-10 DIAGNOSIS — Z79899 Other long term (current) drug therapy: Secondary | ICD-10-CM | POA: Diagnosis not present

## 2023-09-10 DIAGNOSIS — R7309 Other abnormal glucose: Secondary | ICD-10-CM | POA: Diagnosis not present

## 2023-09-10 DIAGNOSIS — Z125 Encounter for screening for malignant neoplasm of prostate: Secondary | ICD-10-CM | POA: Diagnosis not present

## 2023-09-24 DIAGNOSIS — Z0000 Encounter for general adult medical examination without abnormal findings: Secondary | ICD-10-CM | POA: Diagnosis not present

## 2023-09-24 DIAGNOSIS — I10 Essential (primary) hypertension: Secondary | ICD-10-CM | POA: Diagnosis not present

## 2023-09-24 DIAGNOSIS — E6609 Other obesity due to excess calories: Secondary | ICD-10-CM | POA: Diagnosis not present

## 2023-09-24 DIAGNOSIS — Z79899 Other long term (current) drug therapy: Secondary | ICD-10-CM | POA: Diagnosis not present

## 2023-09-24 DIAGNOSIS — Z6833 Body mass index (BMI) 33.0-33.9, adult: Secondary | ICD-10-CM | POA: Diagnosis not present

## 2023-09-24 DIAGNOSIS — N138 Other obstructive and reflux uropathy: Secondary | ICD-10-CM | POA: Diagnosis not present

## 2023-09-24 DIAGNOSIS — R739 Hyperglycemia, unspecified: Secondary | ICD-10-CM | POA: Diagnosis not present

## 2023-09-24 DIAGNOSIS — M130 Polyarthritis, unspecified: Secondary | ICD-10-CM | POA: Diagnosis not present

## 2023-09-24 DIAGNOSIS — Z13220 Encounter for screening for lipoid disorders: Secondary | ICD-10-CM | POA: Diagnosis not present

## 2023-09-24 DIAGNOSIS — E66811 Obesity, class 1: Secondary | ICD-10-CM | POA: Diagnosis not present

## 2023-09-24 DIAGNOSIS — D126 Benign neoplasm of colon, unspecified: Secondary | ICD-10-CM | POA: Diagnosis not present

## 2023-09-24 DIAGNOSIS — N401 Enlarged prostate with lower urinary tract symptoms: Secondary | ICD-10-CM | POA: Diagnosis not present

## 2023-09-24 DIAGNOSIS — Z125 Encounter for screening for malignant neoplasm of prostate: Secondary | ICD-10-CM | POA: Diagnosis not present

## 2024-01-05 DIAGNOSIS — I10 Essential (primary) hypertension: Secondary | ICD-10-CM | POA: Diagnosis not present

## 2024-01-05 DIAGNOSIS — M79621 Pain in right upper arm: Secondary | ICD-10-CM | POA: Diagnosis not present

## 2024-01-05 DIAGNOSIS — M25531 Pain in right wrist: Secondary | ICD-10-CM | POA: Diagnosis not present

## 2024-01-05 DIAGNOSIS — M79601 Pain in right arm: Secondary | ICD-10-CM | POA: Diagnosis not present

## 2024-01-05 DIAGNOSIS — M25521 Pain in right elbow: Secondary | ICD-10-CM | POA: Diagnosis not present

## 2024-01-05 DIAGNOSIS — Z79899 Other long term (current) drug therapy: Secondary | ICD-10-CM | POA: Diagnosis not present

## 2024-01-05 DIAGNOSIS — R079 Chest pain, unspecified: Secondary | ICD-10-CM | POA: Diagnosis not present

## 2024-04-27 ENCOUNTER — Ambulatory Visit
